# Patient Record
Sex: Female | Born: 1960 | Race: White | Hispanic: No | Marital: Married | Smoking: Current every day smoker
Health system: Southern US, Community
[De-identification: ages and names within clinical notes are randomized; demographics above are authoritative.]

## PROBLEM LIST (undated history)

## (undated) DIAGNOSIS — Z5189 Encounter for other specified aftercare: Secondary | ICD-10-CM

## (undated) DIAGNOSIS — F32A Depression, unspecified: Secondary | ICD-10-CM

## (undated) DIAGNOSIS — M1990 Unspecified osteoarthritis, unspecified site: Secondary | ICD-10-CM

## (undated) DIAGNOSIS — J449 Chronic obstructive pulmonary disease, unspecified: Secondary | ICD-10-CM

## (undated) DIAGNOSIS — I10 Essential (primary) hypertension: Secondary | ICD-10-CM

## (undated) DIAGNOSIS — T7840XA Allergy, unspecified, initial encounter: Secondary | ICD-10-CM

## (undated) DIAGNOSIS — E049 Nontoxic goiter, unspecified: Secondary | ICD-10-CM

## (undated) DIAGNOSIS — F419 Anxiety disorder, unspecified: Secondary | ICD-10-CM

## (undated) HISTORY — DX: Nontoxic goiter, unspecified: E04.9

## (undated) HISTORY — DX: Essential (primary) hypertension: I10

## (undated) HISTORY — DX: Depression, unspecified: F32.A

## (undated) HISTORY — PX: TONSILLECTOMY: SHX5217

## (undated) HISTORY — PX: BREAST SURGERY: SHX581

## (undated) HISTORY — DX: Encounter for other specified aftercare: Z51.89

## (undated) HISTORY — DX: Unspecified osteoarthritis, unspecified site: M19.90

## (undated) HISTORY — DX: Chronic obstructive pulmonary disease, unspecified: J44.9

## (undated) HISTORY — DX: Allergy, unspecified, initial encounter: T78.40XA

## (undated) HISTORY — DX: Anxiety disorder, unspecified: F41.9

## (undated) HISTORY — PX: OTHER SURGICAL HISTORY: SHX169

---

## 2006-06-29 ENCOUNTER — Ambulatory Visit: Payer: Self-pay | Admitting: Family Medicine

## 2006-06-29 DIAGNOSIS — E348 Other specified endocrine disorders: Secondary | ICD-10-CM

## 2006-06-29 DIAGNOSIS — E785 Hyperlipidemia, unspecified: Secondary | ICD-10-CM

## 2006-07-29 ENCOUNTER — Ambulatory Visit: Payer: Self-pay | Admitting: Family Medicine

## 2006-07-29 ENCOUNTER — Other Ambulatory Visit: Admission: RE | Admit: 2006-07-29 | Discharge: 2006-07-29 | Payer: Self-pay | Admitting: Family Medicine

## 2006-07-29 ENCOUNTER — Encounter: Payer: Self-pay | Admitting: Family Medicine

## 2006-07-29 DIAGNOSIS — N951 Menopausal and female climacteric states: Secondary | ICD-10-CM

## 2006-07-29 DIAGNOSIS — I10 Essential (primary) hypertension: Secondary | ICD-10-CM

## 2006-07-29 DIAGNOSIS — E049 Nontoxic goiter, unspecified: Secondary | ICD-10-CM | POA: Insufficient documentation

## 2006-07-29 LAB — CONVERTED CEMR LAB
Bilirubin Urine: NEGATIVE
Glucose, Urine, Semiquant: NEGATIVE
Ketones, urine, test strip: NEGATIVE
Nitrite: NEGATIVE
Protein, U semiquant: NEGATIVE
Specific Gravity, Urine: 1.01
Urobilinogen, UA: 0.2
WBC Urine, dipstick: NEGATIVE
pH: 7

## 2006-07-30 ENCOUNTER — Telehealth: Payer: Self-pay | Admitting: Family Medicine

## 2006-07-31 ENCOUNTER — Encounter: Payer: Self-pay | Admitting: Family Medicine

## 2006-07-31 LAB — CONVERTED CEMR LAB
ALT: 13 units/L (ref 0–35)
Cholesterol: 217 mg/dL — ABNORMAL HIGH (ref 0–200)
Glucose, Bld: 92 mg/dL (ref 70–99)
HDL: 52 mg/dL (ref >39)
LDL Cholesterol: 140 mg/dL — ABNORMAL HIGH (ref 0–99)
Sodium: 141 meq/L (ref 135–145)
Triglycerides: 123 mg/dL (ref <150)
VLDL: 25 mg/dL (ref 0–40)

## 2006-08-04 ENCOUNTER — Telehealth (INDEPENDENT_AMBULATORY_CARE_PROVIDER_SITE_OTHER): Payer: Self-pay | Admitting: *Deleted

## 2006-08-17 ENCOUNTER — Telehealth: Payer: Self-pay | Admitting: Family Medicine

## 2006-08-17 ENCOUNTER — Encounter: Payer: Self-pay | Admitting: Family Medicine

## 2006-08-18 ENCOUNTER — Encounter: Payer: Self-pay | Admitting: Family Medicine

## 2006-08-25 ENCOUNTER — Telehealth: Payer: Self-pay | Admitting: Family Medicine

## 2006-08-26 ENCOUNTER — Encounter: Payer: Self-pay | Admitting: Family Medicine

## 2006-08-28 ENCOUNTER — Ambulatory Visit: Payer: Self-pay | Admitting: Family Medicine

## 2006-08-28 DIAGNOSIS — R928 Other abnormal and inconclusive findings on diagnostic imaging of breast: Secondary | ICD-10-CM | POA: Insufficient documentation

## 2006-08-28 DIAGNOSIS — R3129 Other microscopic hematuria: Secondary | ICD-10-CM

## 2006-08-28 LAB — CONVERTED CEMR LAB
Glucose, Urine, Semiquant: NEGATIVE
Ketones, urine, test strip: NEGATIVE
Protein, U semiquant: NEGATIVE
Urobilinogen, UA: NEGATIVE

## 2006-08-29 ENCOUNTER — Encounter: Payer: Self-pay | Admitting: Family Medicine

## 2006-08-29 LAB — CONVERTED CEMR LAB: WBC, UA: NONE SEEN cells/hpf (ref <3)

## 2006-08-31 ENCOUNTER — Telehealth (INDEPENDENT_AMBULATORY_CARE_PROVIDER_SITE_OTHER): Payer: Self-pay | Admitting: *Deleted

## 2006-10-06 ENCOUNTER — Ambulatory Visit: Payer: Self-pay | Admitting: Family Medicine

## 2006-11-19 ENCOUNTER — Ambulatory Visit: Payer: Self-pay | Admitting: Family Medicine

## 2006-11-19 DIAGNOSIS — F411 Generalized anxiety disorder: Secondary | ICD-10-CM | POA: Insufficient documentation

## 2007-01-21 ENCOUNTER — Ambulatory Visit: Payer: Self-pay | Admitting: Family Medicine

## 2007-01-25 ENCOUNTER — Ambulatory Visit: Payer: Self-pay | Admitting: Family Medicine

## 2007-01-25 ENCOUNTER — Encounter: Admission: RE | Admit: 2007-01-25 | Discharge: 2007-01-25 | Payer: Self-pay | Admitting: Family Medicine

## 2007-01-25 DIAGNOSIS — F17200 Nicotine dependence, unspecified, uncomplicated: Secondary | ICD-10-CM | POA: Insufficient documentation

## 2007-09-06 ENCOUNTER — Encounter: Admission: RE | Admit: 2007-09-06 | Discharge: 2007-09-06 | Payer: Self-pay | Admitting: Family Medicine

## 2007-09-06 ENCOUNTER — Encounter: Payer: Self-pay | Admitting: Family Medicine

## 2007-09-06 ENCOUNTER — Ambulatory Visit: Payer: Self-pay | Admitting: Family Medicine

## 2007-09-06 ENCOUNTER — Other Ambulatory Visit: Admission: RE | Admit: 2007-09-06 | Discharge: 2007-09-06 | Payer: Self-pay | Admitting: Family Medicine

## 2007-09-07 ENCOUNTER — Encounter: Payer: Self-pay | Admitting: Family Medicine

## 2007-09-07 LAB — CONVERTED CEMR LAB
ALT: 20 units/L (ref 0–35)
Albumin: 4.5 g/dL (ref 3.5–5.2)
Alkaline Phosphatase: 68 units/L (ref 39–117)
Chloride: 107 meq/L (ref 96–112)
Cholesterol: 198 mg/dL (ref 0–200)
Glucose, Bld: 101 mg/dL — ABNORMAL HIGH (ref 70–99)
HDL goal, serum: 40 mg/dL
HDL: 48 mg/dL (ref >39)
LDL Goal: 130 mg/dL
Total Protein: 7.1 g/dL (ref 6.0–8.3)

## 2007-09-09 LAB — CONVERTED CEMR LAB: Pap Smear: NORMAL

## 2008-02-14 ENCOUNTER — Ambulatory Visit: Payer: Self-pay | Admitting: Family Medicine

## 2008-03-31 ENCOUNTER — Ambulatory Visit: Payer: Self-pay | Admitting: Family Medicine

## 2008-09-14 ENCOUNTER — Ambulatory Visit: Payer: Self-pay | Admitting: Family Medicine

## 2008-09-14 ENCOUNTER — Encounter: Payer: Self-pay | Admitting: Family Medicine

## 2008-09-14 ENCOUNTER — Other Ambulatory Visit: Admission: RE | Admit: 2008-09-14 | Discharge: 2008-09-14 | Payer: Self-pay | Admitting: Family Medicine

## 2008-09-14 LAB — CONVERTED CEMR LAB
Bilirubin Urine: NEGATIVE
Glucose, Urine, Semiquant: NEGATIVE
Ketones, urine, test strip: NEGATIVE
Nitrite: NEGATIVE
Urobilinogen, UA: NEGATIVE
WBC Urine, dipstick: NEGATIVE
pH: 6

## 2008-09-15 LAB — CONVERTED CEMR LAB
AST: 16 units/L (ref 0–37)
Albumin: 4.3 g/dL (ref 3.5–5.2)
Calcium: 9.7 mg/dL (ref 8.4–10.5)
Creatinine, Ser: 0.84 mg/dL (ref 0.40–1.20)
HDL: 47 mg/dL (ref >39)
RBC / HPF: NONE SEEN (ref <3)
Total Protein: 7.1 g/dL (ref 6.0–8.3)
Triglycerides: 107 mg/dL (ref <150)
VLDL: 21 mg/dL (ref 0–40)
WBC, UA: NONE SEEN cells/hpf (ref <3)

## 2008-09-25 ENCOUNTER — Encounter: Admission: RE | Admit: 2008-09-25 | Discharge: 2008-09-25 | Payer: Self-pay | Admitting: Family Medicine

## 2008-10-02 ENCOUNTER — Encounter: Admission: RE | Admit: 2008-10-02 | Discharge: 2008-10-02 | Payer: Self-pay | Admitting: Family Medicine

## 2008-10-25 ENCOUNTER — Telehealth: Payer: Self-pay | Admitting: Family Medicine

## 2008-10-30 ENCOUNTER — Telehealth: Payer: Self-pay | Admitting: Family Medicine

## 2008-11-29 ENCOUNTER — Telehealth (INDEPENDENT_AMBULATORY_CARE_PROVIDER_SITE_OTHER): Payer: Self-pay | Admitting: *Deleted

## 2009-06-29 ENCOUNTER — Other Ambulatory Visit: Admission: RE | Admit: 2009-06-29 | Discharge: 2009-06-29 | Payer: Self-pay | Admitting: Family Medicine

## 2009-06-29 ENCOUNTER — Ambulatory Visit: Payer: Self-pay | Admitting: Family Medicine

## 2009-06-29 LAB — CONVERTED CEMR LAB
Ketones, urine, test strip: NEGATIVE
Nitrite: NEGATIVE
Specific Gravity, Urine: 1.015
Urobilinogen, UA: 0.2
pH: 6.5

## 2009-07-02 ENCOUNTER — Telehealth: Payer: Self-pay | Admitting: Family Medicine

## 2009-07-02 LAB — CONVERTED CEMR LAB
ALT: 14 units/L (ref 0–35)
AST: 13 units/L (ref 0–37)
CO2: 27 meq/L (ref 19–32)
Calcium: 9.3 mg/dL (ref 8.4–10.5)
Chloride: 101 meq/L (ref 96–112)
Cholesterol: 145 mg/dL (ref 0–200)
HDL: 45 mg/dL (ref >39)
LDL Cholesterol: 81 mg/dL (ref 0–99)
TSH: 1.214 microintl units/mL (ref 0.350–4.500)
Total Protein: 7 g/dL (ref 6.0–8.3)
Triglycerides: 93 mg/dL (ref <150)

## 2009-07-03 LAB — CONVERTED CEMR LAB: Pap Smear: NEGATIVE

## 2009-10-01 ENCOUNTER — Encounter: Admission: RE | Admit: 2009-10-01 | Discharge: 2009-10-01 | Payer: Self-pay | Admitting: Family Medicine

## 2010-02-11 ENCOUNTER — Telehealth: Payer: Self-pay | Admitting: Family Medicine

## 2010-02-13 ENCOUNTER — Ambulatory Visit: Payer: Self-pay | Admitting: Family Medicine

## 2010-02-14 ENCOUNTER — Telehealth: Payer: Self-pay | Admitting: Family Medicine

## 2010-02-14 LAB — CONVERTED CEMR LAB
BUN: 18 mg/dL (ref 6–23)
Calcium: 10.7 mg/dL — ABNORMAL HIGH (ref 8.4–10.5)
Potassium: 4.9 meq/L (ref 3.5–5.3)
Sodium: 137 meq/L (ref 135–145)

## 2010-02-20 ENCOUNTER — Telehealth: Payer: Self-pay | Admitting: Family Medicine

## 2010-03-28 ENCOUNTER — Ambulatory Visit: Payer: Self-pay | Admitting: Family Medicine

## 2010-03-29 ENCOUNTER — Encounter: Payer: Self-pay | Admitting: Family Medicine

## 2010-03-29 LAB — CONVERTED CEMR LAB: Calcium Ionized: 1.36 mmol/L — ABNORMAL HIGH (ref 1.12–1.32)

## 2010-05-10 ENCOUNTER — Encounter: Payer: Self-pay | Admitting: Family Medicine

## 2010-05-10 ENCOUNTER — Ambulatory Visit: Payer: Self-pay | Admitting: Family Medicine

## 2010-06-11 NOTE — Letter (Signed)
Summary: Anxiety Questionnaire  Anxiety Questionnaire   Imported By: Lanelle Bal 04/09/2010 13:01:59  _____________________________________________________________________  External Attachment:    Type:   Image     Comment:   External Document

## 2010-06-11 NOTE — Letter (Signed)
Summary: Anxiety Questionnaire  Anxiety Questionnaire   Imported By: Lanelle Bal 02/27/2010 10:44:12  _____________________________________________________________________  External Attachment:    Type:   Image     Comment:   External Document

## 2010-06-11 NOTE — Progress Notes (Signed)
Summary: Doesn't want colonoscopy right now.   ---- Converted from flag ---- ---- 07/02/2009 9:16 AM, Kathlene November wrote:   ---- 07/02/2009 8:42 AM, Darral Dash wrote: Patient does not want to schedule colonoscopy now ,she will call back . ------------------------------

## 2010-06-11 NOTE — Progress Notes (Signed)
Summary: meds  Phone Note Call from Patient Call back at [phone]   Caller: Patient Call For: Nani Gasser MD Summary of Call: pt called and states the insurance co is not going to cover the  lexapro. Pt wants something eles rx at a low dose Initial call taken by: Avon Gully CMA, Duncan Dull),  February 14, 2010 9:18 AM  Follow-up for Phone Call        they should cover it since she has failed several other agent. Jhonnie Garner sould be able 2 get it prior auth'd. Follow-up by: Nani Gasser MD,  February 14, 2010 10:08 AM  Additional Follow-up for Phone Call Additional follow up Details #1::        called pt and told her the pharm should fax over a prior auth form and we can start the PA process Additional Follow-up by: Avon Gully CMA, Duncan Dull),  February 14, 2010 2:56 PM

## 2010-06-11 NOTE — Assessment & Plan Note (Signed)
Summary: F/U anxiety   Vital Signs:  Patient profile:   50 year old female Menstrual status:  postmenopausal Height:      65 inches Weight:      154 pounds Pulse rate:   67 / minute BP sitting:   127 / 78  (right arm) Cuff size:   regular  Vitals Entered By: Avon Gully CMA, Duncan Dull) (March 28, 2010 3:16 PM) CC: f/u mood, meds are helping   Primary Care Yzabella Crunk:  Nani Gasser MD  CC:  f/u mood and meds are helping.  History of Present Illness: f/u mood, meds are helping. Feels she is less teaful. Still feels depressed.  Husband is ill.  Doesn't feel as shakey in the morning. Did cut out her caffeine. Hasn't had to use the lorazepam as much.    Current Medications (verified): 1)  Aspirin 81 Mg Tbec (Aspirin) .... Take One Tablet By Mouth Daily 2)  Cardizem La 120 Mg  Tb24 (Diltiazem Hcl Coated Beads) .... Take 1 Tablet By Mouth Once A Day 3)  Hyzaar 50-12.5 Mg Tabs (Losartan Potassium-Hctz) .... Take 1 Tablet By Mouth Once A Day 4)  Lorazepam 0.5 Mg Tabs (Lorazepam) .Marland Kitchen.. 1 Tab By Mouth Two Times A Day As Needed Axiety 5)  Diclofenac Sodium 75 Mg Tbec (Diclofenac Sodium) .... Take 1 Tablet By Mouth Two Times A Day 6)  Flexeril 10 Mg Tabs (Cyclobenzaprine Hcl) .... Take 1 Tablet By Mouth Once A Day At Bedtime As Needed Muscle Spasm. 7)  Pravastatin Sodium 40 Mg Tabs (Pravastatin Sodium) .... Take 1 Tablet By Mouth Once A Day At Bedtime 8)  Omeprazole 20 Mg Cpdr (Omeprazole) .... Take One Tablet By Mouth Once A Day 9)  Citacal Slow Release 1200 Mg 10)  Citalopram Hydrobromide 10 Mg Tabs (Citalopram Hydrobromide) .... Take 1 Tablet By Mouth Once A Day  Allergies (verified): No Known Drug Allergies  Comments:  Nurse/Medical Assistant: The patient's medications and allergies were reviewed with the patient and were updated in the Medication and Allergy Lists. Avon Gully CMA, Duncan Dull) (March 28, 2010 3:17 PM)  Physical Exam  General:   Well-developed,well-nourished,in no acute distress; alert,appropriate and cooperative throughout examination Lungs:  Normal respiratory effort, chest expands symmetrically. Lungs are clear to auscultation, no crackles or wheezes. Heart:  Normal rate and regular rhythm. S1 and S2 normal without gallop, murmur, click, rub or other extra sounds. Psych:  Cognition and judgment appear intact. Alert and cooperative with normal attention span and concentration. No apparent delusions, illusions, hallucinations   Impression & Recommendations:  Problem # 1:  ANXIETY DISORDER, GENERALIZED (ICD-300.02) Dsicussed different options. talking her into increasing her citalopram to 20mg  daily.   GAD-7 score of 17 today.  Her updated medication list for this problem includes:    Lorazepam 0.5 Mg Tabs (Lorazepam) .Marland Kitchen... 1 tab by mouth two times a day as needed axiety    Citalopram Hydrobromide 20 Mg Tabs (Citalopram hydrobromide) .Marland Kitchen... Take 1 tablet by mouth once a day  Problem # 2:  BLOOD CHEMISTRY, ABNORMAL (ICD-790.99) REcheck calcium level.  Orders: T-Calcium, Ionized ([id number])  Complete Medication List: 1)  Aspirin 81 Mg Tbec (Aspirin) .... Take one tablet by mouth daily 2)  Cardizem La 120 Mg Tb24 (Diltiazem hcl coated beads) .... Take 1 tablet by mouth once a day 3)  Hyzaar 50-12.5 Mg Tabs (Losartan potassium-hctz) .... Take 1 tablet by mouth once a day 4)  Lorazepam 0.5 Mg Tabs (Lorazepam) .Marland Kitchen.. 1 tab by mouth two times a  day as needed axiety 5)  Diclofenac Sodium 75 Mg Tbec (Diclofenac sodium) .... Take 1 tablet by mouth two times a day 6)  Flexeril 10 Mg Tabs (Cyclobenzaprine hcl) .... Take 1 tablet by mouth once a day at bedtime as needed muscle spasm. 7)  Pravastatin Sodium 40 Mg Tabs (Pravastatin sodium) .... Take 1 tablet by mouth once a day at bedtime 8)  Omeprazole 20 Mg Cpdr (Omeprazole) .... Take one tablet by mouth once a day 9)  Citacal Slow Release 1200 Mg  10)  Citalopram  Hydrobromide 20 Mg Tabs (Citalopram hydrobromide) .... Take 1 tablet by mouth once a day  Patient Instructions: 1)  Please schedule a follow-up appointment in 6 weeks for mood. 2)  We will increase your citalopram dose.   Prescriptions: CITALOPRAM HYDROBROMIDE 20 MG TABS (CITALOPRAM HYDROBROMIDE) Take 1 tablet by mouth once a day  #30 x 1   Entered and Authorized by:   Nani Gasser MD   Signed by:   Nani Gasser MD on 03/28/2010   Method used:   Electronically to        [facility] (retail)       [address]       [place], [place]  [address]       Ph: [phone]       Fax: [phone]   RxID:   [id number]    Orders Added: 1)  T-Calcium, Ionized [[id number]] 2)  Est. Patient Level III [93235]

## 2010-06-11 NOTE — Assessment & Plan Note (Signed)
Summary: CPE, mood, nausea   Vital Signs:  Patient profile:   50 year old female Menstrual status:  postmenopausal Height:      65 inches Weight:      165 pounds BMI:     27.56 Pulse rate:   80 / minute BP sitting:   124 / 75  (left arm) Cuff size:   regular  Vitals Entered By: Kathlene November (June 29, 2009 9:16 AM) CC: CPE with pap     Menstrual Status postmenopausal Last PAP Result Normal   Primary Care Provider:  Nani Gasser MD  CC:  CPE with pap.  History of Present Illness: Feels under alot of stress right now.  Mother has dementia and she is the primary care taker on teh weekend.  Daughter has moved to [place].  She restarted the fluoxetine about 3 weeks ago, 20mg , but doesn' really like the way it makes her feel. Feels too sedated and a little dizzy. SAys that she does feel it helps some with her mood.  NOt sleeping well. Very stressed.  Tearful.   Her stomach has been bothering her. Has been intermittant nausea, belching and some flank pain and between her shoulder blades. Still has a GB.  Sometime will then have a BM an hour later.  Has been eating differnt.ly.  REstarted a reflux medicine last week.  Has helped some but not complete resolution of her sxs.  Also have early satiety.    Current Medications (verified): 1)  Aspirin 81 Mg Tbec (Aspirin) .... Take One Tablet By Mouth Daily 2)  Cardizem La 120 Mg  Tb24 (Diltiazem Hcl Coated Beads) .... Take 1 Tablet By Mouth Once A Day 3)  Hyzaar 50-12.5 Mg Tabs (Losartan Potassium-Hctz) .... Take 1 Tablet By Mouth Once A Day 4)  Fluoxetine Hcl 40 Mg Caps (Fluoxetine Hcl) .... Take 1 Tablet By Mouth Once A Day 5)  Lorazepam 0.5 Mg Tabs (Lorazepam) .Marland Kitchen.. 1 Tab By Mouth Two Times A Day As Needed Axiety 6)  Diclofenac Sodium 75 Mg Tbec (Diclofenac Sodium) .... Take 1 Tablet By Mouth Two Times A Day 7)  Flexeril 10 Mg Tabs (Cyclobenzaprine Hcl) .... Take 1 Tablet By Mouth Once A Day At Bedtime As Needed Muscle Spasm. 8)   Pravastatin Sodium 40 Mg Tabs (Pravastatin Sodium) .... Take 1 Tablet By Mouth Once A Day At Bedtime 9)  Omeprazole 20 Mg Cpdr (Omeprazole) .... Take One Tablet By Mouth Once A Day  Allergies (verified): No Known Drug Allergies  Comments:  Nurse/Medical Assistant: The patient's medications and allergies were reviewed with the patient and were updated in the Medication and Allergy Lists. Kathlene November (June 29, 2009 9:20 AM)  Past History:  Past Medical History: Last updated: 01/25/2007 Marlowe Kays.   (WS) Spirometry 01-25-07  Past Surgical History: Last updated: 07/29/2006 Tonsillectomy age 65 Cyst removed from right breast Removed broken tail bone  Family History: Last updated: 09/14/2008 HTN, hi cholesterol, DM (sister and father) Mother with HTN, hi chol, alzheimers.   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  Current Smoker Alcohol use-no Drug use-no Regular exercise-no  Family History: Reviewed history from 09/14/2008 and no changes required. HTN, hi cholesterol, DM (sister and father) Mother with HTN, hi chol, alzheimers.   Social History: Reviewed history from 06/29/2006 and no changes required. Transport planner at [employer].  Current Smoker Alcohol use-no Drug use-no Regular exercise-no  Review of Systems  The patient denies anorexia, fever, weight loss,  weight gain, vision loss, decreased hearing, hoarseness, chest pain, syncope, dyspnea on exertion, peripheral edema, prolonged cough, headaches, hemoptysis, abdominal pain, melena, hematochezia, severe indigestion/heartburn, hematuria, incontinence, genital sores, muscle weakness, suspicious skin lesions, transient blindness, difficulty walking, depression, unusual weight change, abnormal bleeding, enlarged lymph nodes, angioedema, breast masses, and testicular masses.    Physical Exam  General:  Well-developed,well-nourished,in no acute  distress; alert,appropriate and cooperative throughout examination Head:  Normocephalic and atraumatic without obvious abnormalities. No apparent alopecia or balding. Eyes:  No corneal or conjunctival inflammation noted. EOMI. Perrla. Ears:  External ear exam shows no significant lesions or deformities.  Otoscopic examination reveals clear canals, tympanic membranes are intact bilaterally without bulging, retraction, inflammation or discharge. Hearing is grossly normal bilaterally. Nose:  External nasal examination shows no deformity or inflammation. Nasal mucosa are pink and moist without lesions or exudates. Mouth:  Oral mucosa and oropharynx without lesions or exudates.  Teeth in good repair. Neck:  No deformities, masses, or tenderness noted. Chest Wall:  No deformities, masses, or tenderness noted. Breasts:  No mass, nodules, thickening, tenderness, bulging, retraction, inflamation, nipple discharge or skin changes noted.   Lungs:  Normal respiratory effort, chest expands symmetrically. Lungs are clear to auscultation, no crackles or wheezes. Heart:  Normal rate and regular rhythm. S1 and S2 normal without gallop, murmur, click, rub or other extra sounds. Abdomen:  soft, normal bowel sounds, no distention, no hepatomegaly, and no splenomegaly.  Mildly tender over the epigastrum.  Genitalia:  Normal introitus for age, no external lesions, no vaginal discharge, mucosa pink and moist, no vaginal or cervical lesions, no vaginal atrophy, no friaility or hemorrhage, normal uterus size and position, no adnexal masses or tenderness Msk:  No deformity or scoliosis noted of thoracic or lumbar spine.   Pulses:  R and L carotid,radial,femoral,dorsalis pedis and posterior tibial pulses are full and equal bilaterally Extremities:  No clubbing, cyanosis, edema, or deformity noted with normal full range of motion of all joints.   Neurologic:  No cranial nerve deficits noted. Station and gait are normal.  Sensory, motor and coordinative functions appear intact. Skin:  no rashes.   Cervical Nodes:  No lymphadenopathy noted Axillary Nodes:  No palpable lymphadenopathy Psych:  Cognition and judgment appear intact. Alert and cooperative with normal attention span and concentration. No apparent delusions, illusions, hallucinations   Impression & Recommendations:  Problem # 1:  ROUTINE GYNECOLOGICAL EXAMINATION (ICD-V72.31) Discused calcium with vitamin D Discussed screening colonoscopy.  Will add HPV testing to pap. If normal then repeat in 3 years.  Due for screening labs and mammogram.  Orders: T-Comprehensive Metabolic Panel [id number]) T-Lipid Profile ([id number]) T-TSH ([id number]) T-Mammography Bilateral Screening (57846)  Problem # 2:  ANXIETY DISORDER, GENERALIZED (ICD-300.02) Not happy with the prozac. Says it make her feel almost to relaxed like she doesn't care about anything.  She is acutally taking 20mg  daily. Will change to citalopram and f/u in 3-4 weeks. Call if any SE or suicidal thoughts.   Her updated medication list for this problem includes:    Citalopram Hydrobromide 20 Mg Tabs (Citalopram hydrobromide) .Marland Kitchen... 1/2 tab by mouth for one week then increase to 1 whole tab.    Lorazepam 0.5 Mg Tabs (Lorazepam) .Marland Kitchen... 1 tab by mouth two times a day as needed axiety  Problem # 3:  NAUSEA (ICD-787.02) Trial of PPI. Samples of dexilant given.  If not better in one week then call and will schedule a GB U/S.  UA is neg for UTI.  Complete Medication List: 1)  Aspirin 81 Mg Tbec (Aspirin) .... Take one tablet by mouth daily 2)  Cardizem La 120 Mg Tb24 (Diltiazem hcl coated beads) .... Take 1 tablet by mouth once a day 3)  Hyzaar 50-12.5 Mg Tabs (Losartan potassium-hctz) .... Take 1 tablet by mouth once a day 4)  Citalopram Hydrobromide 20 Mg Tabs (Citalopram hydrobromide) .... 1/2 tab by mouth for one week then increase to 1 whole tab. 5)  Lorazepam 0.5 Mg Tabs (Lorazepam)  .Marland Kitchen.. 1 tab by mouth two times a day as needed axiety 6)  Diclofenac Sodium 75 Mg Tbec (Diclofenac sodium) .... Take 1 tablet by mouth two times a day 7)  Flexeril 10 Mg Tabs (Cyclobenzaprine hcl) .... Take 1 tablet by mouth once a day at bedtime as needed muscle spasm. 8)  Pravastatin Sodium 40 Mg Tabs (Pravastatin sodium) .... Take 1 tablet by mouth once a day at bedtime 9)  Omeprazole 20 Mg Cpdr (Omeprazole) .... Take one tablet by mouth once a day  Other Orders: UA Dipstick w/o Micro (automated)  (81003) Gastroenterology Referral (GI)  Patient Instructions: 1)  Can stop fluoexetine and start the citalopram for mood. New rx sent to pharmacy. Follow up in one month so we can adjust dose.  2)  Call next week if not feeing better and will schedule for a Gallbladder U/S.   3)  It is important that you exercise reguarly at least 20 minutes 5 times a week. If you develop chest pain, have severe difficulty breathing, or feel very tired, stop exercising immediately and seek medical attention.  4)  Take calcium +vitamin D daily.  Prescriptions: CITALOPRAM HYDROBROMIDE 20 MG TABS (CITALOPRAM HYDROBROMIDE) 1/2 tab by mouth for one week then increase to 1 whole tab.  #30 x 0   Entered and Authorized by:   Nani Gasser MD   Signed by:   Nani Gasser MD on 06/29/2009   Method used:   Electronically to        [facility] (retail)       [address]       [place], [place]  [address]       Ph: [phone]       Fax: [phone]   RxID:   [id number]   Laboratory Results   Urine Tests  Date/Time Received: 06/29/2009 Date/Time Reported: 06/29/2009  Routine Urinalysis   Color: yellow Appearance: Clear Glucose: negative   (Normal Range: Negative) Bilirubin: negative   (Normal Range: Negative) Ketone: negative   (Normal Range: Negative) Spec. Gravity: 1.015   (Normal Range: 1.003-1.035) Blood: small   (Normal Range: Negative) pH: 6.5   (Normal Range: 5.0-8.0) Protein: negative    (Normal Range: Negative) Urobilinogen: 0.2   (Normal Range: 0-1) Nitrite: negative   (Normal Range: Negative) Leukocyte Esterace: negative   (Normal Range: Negative)         Flu Vaccine Next Due:  Refused

## 2010-06-11 NOTE — Medication Information (Signed)
Summary: Lexapro/[facility]  Lexapro/[facility]   Imported By: Lanelle Bal 02/27/2010 09:20:50  _____________________________________________________________________  External Attachment:    Type:   Image     Comment:   External Document

## 2010-06-11 NOTE — Progress Notes (Signed)
Summary: meds  Phone Note Call from Patient Call back at [phone]   Caller: Patient Call For: Nani Gasser MD Summary of Call: wants to do the citalopram 10 mg because pharm said it was equal to lexapro and is cheaper Initial call taken by: Avon Gully CMA, Duncan Dull),  February 20, 2010 4:13 PM  Follow-up for Phone Call        I offered this during the OV but she wanted to try Lexapro. that Is fine. I will change teh rx.  Follow-up by: Nani Gasser MD,  February 21, 2010 8:10 AM  Additional Follow-up for Phone Call Additional follow up Details #1::        Pt notified.  Additional Follow-up by: Kathlene November LPN,  February 21, 2010 8:16 AM    New/Updated Medications: CITALOPRAM HYDROBROMIDE 10 MG TABS (CITALOPRAM HYDROBROMIDE) Take 1 tablet by mouth once a day Prescriptions: CITALOPRAM HYDROBROMIDE 10 MG TABS (CITALOPRAM HYDROBROMIDE) Take 1 tablet by mouth once a day  #30 x 1   Entered and Authorized by:   Nani Gasser MD   Signed by:   Nani Gasser MD on 02/21/2010   Method used:   Electronically to        [facility] (retail)       [address]       [place], [place]  [address]       Ph: [phone]       Fax: [phone]   RxID:   [id number]

## 2010-06-11 NOTE — Progress Notes (Signed)
Summary: rx  Phone Note Call from Patient   Caller: Patient Call For: Nani Gasser MD Summary of Call: pt called and states she would like a rx for chantix and wants to go on wellbutrin but does not know the dose of the wellbutrin and does not know if they can be taken together Initial call taken by: Avon Gully CMA, Duncan Dull),  February 11, 2010 10:48 AM  Follow-up for Phone Call        REc one or the other. Which would she prefer?  Follow-up by: Nani Gasser MD,  February 11, 2010 11:54 AM  Additional Follow-up for Phone Call Additional follow up Details #1::        pt wants both and states the prozac and other medications did not help. should I just tell her to schedule an appt to discuss? Additional Follow-up by: Avon Gully CMA, Duncan Dull),  February 11, 2010 1:03 PM    Additional Follow-up for Phone Call Additional follow up Details #2::    Needs OV.  She may just need a higher dose on teh celexa. She has been on it since FEB and hasn't followed up.  Follow-up by: Nani Gasser MD,  February 11, 2010 1:08 PM  Additional Follow-up for Phone Call Additional follow up Details #3:: Details for Additional Follow-up Action Taken: Pt notified of above and sent to schedule office visit Additional Follow-up by: Kathlene November LPN,  February 11, 2010 4:58 PM

## 2010-06-11 NOTE — Assessment & Plan Note (Signed)
Summary: f/u on meds   Vital Signs:  Patient profile:   50 year old female Menstrual status:  postmenopausal Height:      65 inches Weight:      154 pounds Pulse rate:   70 / minute BP sitting:   126 / 72  (right arm) Cuff size:   regular  Vitals Entered By: Avon Gully CMA, Duncan Dull) (February 13, 2010 9:31 AM) CC: f/u meds, Hypertension Management   Primary Care Provider:  Nani Gasser MD  CC:  f/u meds and Hypertension Management.  History of Present Illness: Has lost 10 lbs since last OV. Has been eating a low carb, high protein diet.  Not really exercising.  Here to F/u BP.   Feels her anxiety is getting worse. Mom with dementia and husband getting ready to have surgery.She has been very stressed.  Has felt down as well. Started drinking wine at night to help her relax.  Still smoking.  Wants to retry the Chantix but knows her anxiety has to be under control as well. Marland Kitchen Her husband is trying ot quit adn she wants to quit too. Says with prozac, paxil, effexor, sertraline, and wellbutrin  and has fetl too apethetic on it.  Also had weight gain with several of them.   Hypertension History:      She denies headache, chest pain, palpitations, dyspnea with exertion, orthopnea, PND, peripheral edema, visual symptoms, neurologic problems, syncope, and side effects from treatment.  She notes no problems with any antihypertensive medication side effects.        Positive major cardiovascular risk factors include hyperlipidemia, hypertension, and current tobacco user.  Negative major cardiovascular risk factors include female age less than 47 years old, no history of diabetes, and negative family history for ischemic heart disease.        Further assessment for target organ damage reveals no history of ASHD, stroke/TIA, or peripheral vascular disease.     Current Medications (verified): 1)  Aspirin 81 Mg Tbec (Aspirin) .... Take One Tablet By Mouth Daily 2)  Cardizem La 120 Mg  Tb24  (Diltiazem Hcl Coated Beads) .... Take 1 Tablet By Mouth Once A Day 3)  Hyzaar 50-12.5 Mg Tabs (Losartan Potassium-Hctz) .... Take 1 Tablet By Mouth Once A Day 4)  Lorazepam 0.5 Mg Tabs (Lorazepam) .Marland Kitchen.. 1 Tab By Mouth Two Times A Day As Needed Axiety 5)  Diclofenac Sodium 75 Mg Tbec (Diclofenac Sodium) .... Take 1 Tablet By Mouth Two Times A Day 6)  Flexeril 10 Mg Tabs (Cyclobenzaprine Hcl) .... Take 1 Tablet By Mouth Once A Day At Bedtime As Needed Muscle Spasm. 7)  Pravastatin Sodium 40 Mg Tabs (Pravastatin Sodium) .... Take 1 Tablet By Mouth Once A Day At Bedtime 8)  Omeprazole 20 Mg Cpdr (Omeprazole) .... Take One Tablet By Mouth Once A Day 9)  Citacal Slow Release 1200 Mg  Allergies (verified): No Known Drug Allergies  Comments:  Nurse/Medical Assistant: The patient's medications and allergies were reviewed with the patient and were updated in the Medication and Allergy Lists. 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  Current Smoker Alcohol use-no Drug use-no Regular exercise-no  Physical Exam  General:  Well-developed,well-nourished,in no acute distress; alert,appropriate and cooperative throughout examination Head:  Normocephalic and atraumatic without obvious abnormalities. No apparent alopecia or balding. Lungs:  Normal respiratory effort, chest expands symmetrically. Lungs are clear to  auscultation, no crackles or wheezes. Heart:  Normal rate and regular rhythm. S1 and S2 normal without gallop, murmur, click, rub or other extra sounds. Skin:  no rashes.   Cervical Nodes:  No lymphadenopathy noted Psych:  Cognition and judgment appear intact. Alert and cooperative with normal attention span and concentration. No apparent delusions, illusions, hallucinations   Impression & Recommendations:  Problem # 1:  HYPERTENSION, BENIGN ESSENTIAL (ICD-401.1)  Looks  great today. Continue current regimen. Due for BMP.  Her updated medication list for this problem includes:    Cardizem La 120 Mg Tb24 (Diltiazem hcl coated beads) .Marland Kitchen... Take 1 tablet by mouth once a day    Hyzaar 50-12.5 Mg Tabs (Losartan potassium-hctz) .Marland Kitchen... 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   Problem # 3:  ANXIETY DISORDER, GENERALIZED (ICD-300.02) GAD-7 score is 19 (severe) today. Discussed different options. She has done some reading about Lexapro adn is most interested in that. She feels she is very sensitive to medication so will start with the 5mg  tab sdn have her f/u in 3 weeks. Discussed potential SE.  Can use the lorazepam as needed. I think she would benefit from counseling as well.  The following medications were removed from the medication list:    Citalopram Hydrobromide 20 Mg Tabs (Citalopram hydrobromide) .Marland Kitchen... 1/2 tab by mouth for one week then increase to 1 whole tab. Her updated medication list for this problem includes:    Lorazepam 0.5 Mg Tabs (Lorazepam) .Marland Kitchen... 1 tab by mouth two times a day as needed axiety    Lexapro 5 Mg Tabs (Escitalopram oxalate) .Marland Kitchen... Take 1 tablet by mouth once a day  Problem # 4:  DISORDER, TOBACCO USE (ICD-305.1) I would love for her to quit and try the Chantix again but I agree that her mood needs to be much better controlled to safely use this medication and to be successful.   Complete Medication List: 1)  Aspirin 81 Mg Tbec (Aspirin) .... Take one tablet by mouth daily 2)  Cardizem La 120 Mg Tb24 (Diltiazem hcl coated beads) .... Take 1 tablet by mouth once a day 3)  Hyzaar 50-12.5 Mg Tabs (Losartan potassium-hctz) .... Take 1 tablet by mouth once a day 4)  Lorazepam 0.5 Mg Tabs (Lorazepam) .Marland Kitchen.. 1 tab by mouth two times a day as needed axiety 5)  Diclofenac Sodium 75 Mg Tbec (Diclofenac sodium) .... Take 1 tablet by mouth  two times a day 6)  Flexeril 10 Mg Tabs (Cyclobenzaprine hcl) .... Take 1 tablet by mouth once a day at bedtime as needed muscle spasm. 7)  Pravastatin Sodium 40 Mg Tabs (Pravastatin sodium) .... Take 1 tablet by mouth once a day at bedtime 8)  Omeprazole 20 Mg Cpdr (Omeprazole) .... Take one tablet by mouth once a day 9)  Citacal Slow Release 1200 Mg  10)  Lexapro 5 Mg Tabs (Escitalopram oxalate) .... Take 1 tablet by mouth once a day  Hypertension Assessment/Plan:      The patient's hypertensive risk group is category B: At least one risk factor (excluding diabetes) with no target organ damage.  Her calculated 10 year risk of coronary heart disease is 8 %.  Today's blood pressure is 126/72.    Contraindications/Deferment of Procedures/Staging:    Test/Procedure: FLU VAX    Reason for deferment: patient declined   Patient Instructions: 1)  Please schedule a follow-up  appointment in 3 weeks.  Prescriptions: LEXAPRO 5 MG TABS (ESCITALOPRAM OXALATE) Take 1 tablet by mouth once a day  #30 x 0   Entered and Authorized by:   Nani Gasser MD   Signed by:   Nani Gasser MD on 02/13/2010   Method used:   Electronically to        [facility] (retail)       [address]       [place], [place]  [address]       Ph: [phone]       Fax: [phone]   RxID:   [id number]

## 2010-06-13 NOTE — Assessment & Plan Note (Signed)
Summary: 6 week f/u mood   Vital Signs:  Patient profile:   50 year old female Menstrual status:  postmenopausal Height:      65 inches Weight:      158 pounds Pulse rate:   66 / minute BP sitting:   119 / 73  (right arm) Cuff size:   regular  Vitals Entered By: Avon Gully CMA, Duncan Dull) (May 10, 2010 3:34 PM) CC: f/u anxiety   Primary Care Gildo Crisco:  Nani Gasser MD  CC:  f/u anxiety.  History of Present Illness: Not exercising regularly.  Overall says she is better and her mood is better. She is doing well. Still feels some lack of motivation but less irritated. Sleeping well nad denies any SE. Says does feel it has inc her appetite and cares less about what she is eating (less healthy choices).   Current Medications (verified): 1)  Aspirin 81 Mg Tbec (Aspirin) .... Take One Tablet By Mouth Daily 2)  Cardizem La 120 Mg  Tb24 (Diltiazem Hcl Coated Beads) .... Take 1 Tablet By Mouth Once A Day 3)  Hyzaar 50-12.5 Mg Tabs (Losartan Potassium-Hctz) .... Take 1 Tablet By Mouth Once A Day 4)  Lorazepam 0.5 Mg Tabs (Lorazepam) .Marland Kitchen.. 1 Tab By Mouth Two Times A Day As Needed Axiety 5)  Diclofenac Sodium 75 Mg Tbec (Diclofenac Sodium) .... Take 1 Tablet By Mouth Two Times A Day 6)  Flexeril 10 Mg Tabs (Cyclobenzaprine Hcl) .... Take 1 Tablet By Mouth Once A Day At Bedtime As Needed Muscle Spasm. 7)  Pravastatin Sodium 40 Mg Tabs (Pravastatin Sodium) .... Take 1 Tablet By Mouth Once A Day At Bedtime 8)  Omeprazole 20 Mg Cpdr (Omeprazole) .... Take One Tablet By Mouth Once A Day 9)  Citacal Slow Release 1200 Mg 10)  Citalopram Hydrobromide 20 Mg Tabs (Citalopram Hydrobromide) .... Take 1 Tablet By Mouth Once A Day  Allergies (verified): No Known Drug Allergies  Comments:  Nurse/Medical Assistant: The patient's medications and allergies were reviewed with the patient and were updated in the Medication and Allergy Lists. Avon Gully CMA, Duncan Dull) (May 10, 2010 3:35  PM)  Physical Exam  General:  Well-developed,well-nourished,in no acute distress; alert,appropriate and cooperative throughout examination Lungs:  Normal respiratory effort, chest expands symmetrically. Lungs are clear to auscultation, no crackles or wheezes. Heart:  Normal rate and regular rhythm. S1 and S2 normal without gallop, murmur, click, rub or other extra sounds. Skin:  no rashes.     Impression & Recommendations:  Problem # 1:  ANXIETY DISORDER, GENERALIZED (ICD-300.02) GAD-7 score os 6 today. She is doing very well. Discused increasing her dose but she wants to to stay on her current dose.  She says she is happy with her progression.  I strongly encouraged her to start some regular exercise as this will also improve her mood as well.  She does have a neighbor who has been wanting her to come to the gym with her and I encouraged her to do this.  Follow-up in 4 months. Her updated medication list for this problem includes:    Lorazepam 0.5 Mg Tabs (Lorazepam) .Marland Kitchen... 1 tab by mouth two times a day as needed axiety    Citalopram Hydrobromide 20 Mg Tabs (Citalopram hydrobromide) .Marland Kitchen... Take 1 tablet by mouth once a day  Problem # 2:  HYPERCALCEMIA (ICD-275.42) she is completely stopped her calcium supplement and we are due to recheck her calcium today.  He was elevated a month ago. Orders: T-Calcium,  Ionized [id number])  Complete Medication List: 1)  Aspirin 81 Mg Tbec (Aspirin) .... Take one tablet by mouth daily 2)  Cardizem La 120 Mg Tb24 (Diltiazem hcl coated beads) .... Take 1 tablet by mouth once a day 3)  Hyzaar 50-12.5 Mg Tabs (Losartan potassium-hctz) .... Take 1 tablet by mouth once a day 4)  Lorazepam 0.5 Mg Tabs (Lorazepam) .Marland Kitchen.. 1 tab by mouth two times a day as needed axiety 5)  Diclofenac Sodium 75 Mg Tbec (Diclofenac sodium) .... Take 1 tablet by mouth two times a day 6)  Flexeril 10 Mg Tabs (Cyclobenzaprine hcl) .... Take 1 tablet by mouth once a day at bedtime as  needed muscle spasm. 7)  Pravastatin Sodium 40 Mg Tabs (Pravastatin sodium) .... Take 1 tablet by mouth once a day at bedtime 8)  Omeprazole 20 Mg Cpdr (Omeprazole) .... Take one tablet by mouth once a day 9)  Citacal Slow Release 1200 Mg  10)  Citalopram Hydrobromide 20 Mg Tabs (Citalopram hydrobromide) .... Take 1 tablet by mouth once a day  Patient Instructions: 1)  Please schedule a follow-up appointment in 4 months for mood .  2)  Start an exercise routine.    Orders Added: 1)  T-Calcium, Ionized [[id number]] 2)  Est. Patient Level III [14782]

## 2010-06-13 NOTE — Letter (Signed)
Summary: Anxiety Questionnaire  Anxiety Questionnaire   Imported By: Lanelle Bal 05/22/2010 08:59:51  _____________________________________________________________________  External Attachment:    Type:   Image     Comment:   External Document

## 2010-08-16 ENCOUNTER — Other Ambulatory Visit: Payer: Self-pay | Admitting: Family Medicine

## 2010-09-03 ENCOUNTER — Other Ambulatory Visit: Payer: Self-pay | Admitting: Family Medicine

## 2010-09-05 ENCOUNTER — Encounter: Payer: Self-pay | Admitting: Family Medicine

## 2010-09-06 ENCOUNTER — Ambulatory Visit
Admission: RE | Admit: 2010-09-06 | Discharge: 2010-09-06 | Disposition: A | Discharge status: Home / Self Care | Payer: 59 | Source: Ambulatory Visit | Attending: Family Medicine | Admitting: Family Medicine

## 2010-09-06 ENCOUNTER — Telehealth: Payer: Self-pay | Admitting: Family Medicine

## 2010-09-06 ENCOUNTER — Ambulatory Visit (INDEPENDENT_AMBULATORY_CARE_PROVIDER_SITE_OTHER): Payer: 59 | Admitting: Family Medicine

## 2010-09-06 ENCOUNTER — Encounter: Payer: Self-pay | Admitting: Family Medicine

## 2010-09-06 DIAGNOSIS — Z1211 Encounter for screening for malignant neoplasm of colon: Secondary | ICD-10-CM

## 2010-09-06 DIAGNOSIS — M542 Cervicalgia: Secondary | ICD-10-CM

## 2010-09-06 DIAGNOSIS — Z1239 Encounter for other screening for malignant neoplasm of breast: Secondary | ICD-10-CM

## 2010-09-06 DIAGNOSIS — E010 Iodine-deficiency related diffuse (endemic) goiter: Secondary | ICD-10-CM

## 2010-09-06 DIAGNOSIS — E049 Nontoxic goiter, unspecified: Secondary | ICD-10-CM

## 2010-09-06 DIAGNOSIS — Z0000 Encounter for general adult medical examination without abnormal findings: Secondary | ICD-10-CM

## 2010-09-06 DIAGNOSIS — Z1231 Encounter for screening mammogram for malignant neoplasm of breast: Secondary | ICD-10-CM

## 2010-09-06 NOTE — Telephone Encounter (Signed)
Call pt: Single left tiny thyroid cyst. Will await lab results.

## 2010-09-06 NOTE — Progress Notes (Signed)
  Subjective:     Breanna Gomez is a 50 y.o. female and is here for a comprehensive physical exam. The patient reports Pain over both clavicles for one month.  Now radiating into her anterior neck.  Occ feels numb. More painful at night when tries to sleep. no trauma. .She has no dysphagia. Occ burping but no epigastric pain. Feels sore almost daily. Not taking anything for it.  Also noticed occ tingling in her feet bilaterally. She recently started B12 about a week ago.    History   Social History  . Marital Status: Married    Spouse Name: N/A    Number of Children: N/A  . Years of Education: N/A   Occupational History  . Not on file.   Social History Main Topics  . Smoking status: Current Everyday Smoker  . Smokeless tobacco: Not on file  . Alcohol Use: No  . Drug Use: No  . Sexually Active:    Other Topics Concern  . Not on file   Social History Narrative  . No narrative on file   Health Maintenance  Topic Date Due  . Colonoscopy  01/10/2009  . Influenza Vaccine  02/10/2011  . Mammogram  10/02/2011  . Pap Smear  06/29/2012  . Tetanus/tdap  09/15/2018    The following portions of the patient's history were reviewed and updated as appropriate: allergies, current medications, past family history, past medical history, past social history, past surgical history and problem list.  Review of Systems Pertinent items are noted in HPI.   Objective:    BP 123/79  Pulse 81  Ht 5\' 5"  (1.651 m)  Wt 157 lb (71.215 kg)  BMI 26.13 kg/m2 General appearance: alert, cooperative and appears stated age.  Head: Normocephalic, without obvious abnormality, atraumatic Eyes: PEERLA, EOMi, conjunctiva clear.  Ears: normal TM's and external ear canals both ears Nose: Nares normal. Septum midline. Mucosa normal. No drainage or sinus tenderness. Throat: abnormal findings: symmetrically enlarged thyroid, mildly tender. Neck: no adenopathy, no carotid bruit, no JVD and supple, symmetrical,  trachea midline Back: symmetric, no curvature. ROM normal. No CVA tenderness. Lungs: clear to auscultation bilaterally.  Nontender over the clavicles, no masses or lumps.  Breasts: normal appearance, no masses or tenderness Heart: regular rate and rhythm, S1, S2 normal, no murmur, click, rub or gallop Abdomen: soft, non-tender; bowel sounds normal; no masses,  no organomegaly Extremities: extremities normal, atraumatic, no cyanosis or edema Pulses: 2+ and symmetric Skin: Skin color, texture, turgor normal. No rashes or lesions Lymph nodes: Cervical, supraclavicular, and axillary nodes normal.    Assessment:    Healthy female exam.  Neck pain   Plan:     See After Visit Summary for Counseling Recommendations  Screening labs today Discussed need for colonoscopy Due for mammo in May. Pap up to date.  Encouraged using sunscreen, reg exercise and healthy diet.   Neck pain and thyromegaly - will check her thyroid levels and set up for [place].  She had a nuclear scan a few years ago. Also since recently had elevation in her calcium (that improved after stopping her calcium supplement) will recheck today and will also check her PTH as well. Recommend trial of Aleve for pain and anti-inflammatory properties or tylenol. If not controlling her pain then let me know. F/u in 3 weeks.

## 2010-09-07 LAB — LIPID PANEL
LDL Cholesterol: 98 mg/dL (ref 0–99)
Triglycerides: 115 mg/dL (ref <150)
VLDL: 23 mg/dL (ref 0–40)

## 2010-09-07 LAB — COMPLETE METABOLIC PANEL WITH GFR
ALT: 19 U/L (ref 0–35)
AST: 18 U/L (ref 0–37)
Albumin: 4.4 g/dL (ref 3.5–5.2)
Calcium: 10.2 mg/dL (ref 8.4–10.5)
Chloride: 103 mEq/L (ref 96–112)
Potassium: 5.2 mEq/L (ref 3.5–5.3)
Sodium: 139 mEq/L (ref 135–145)

## 2010-09-07 LAB — FOLATE: Folate: 8.8 ng/mL

## 2010-09-07 LAB — VITAMIN B12: Vitamin B-12: 1086 pg/mL — ABNORMAL HIGH (ref 211–911)

## 2010-09-07 LAB — T4, FREE: Free T4: 1.18 ng/dL (ref 0.80–1.80)

## 2010-09-09 ENCOUNTER — Telehealth: Payer: Self-pay | Admitting: Family Medicine

## 2010-09-09 LAB — PTH, INTACT AND CALCIUM
Calcium, Total (PTH): 10.2 mg/dL (ref 8.4–10.5)
PTH: 45.3 pg/mL (ref 14.0–72.0)

## 2010-09-09 NOTE — Telephone Encounter (Signed)
Left message on pts cell

## 2010-09-09 NOTE — Telephone Encounter (Signed)
Call [id number] and PTH are normal. Thyroid panel looks great. Vit b12 is high so can decrease any B12 supplement that she is taking. CMP nad lipids look great!

## 2010-09-10 NOTE — Telephone Encounter (Signed)
Left message on pt's vm with results

## 2010-10-02 ENCOUNTER — Encounter: Payer: Self-pay | Admitting: Family Medicine

## 2010-10-02 ENCOUNTER — Ambulatory Visit (INDEPENDENT_AMBULATORY_CARE_PROVIDER_SITE_OTHER): Payer: 59 | Admitting: Family Medicine

## 2010-10-02 DIAGNOSIS — J329 Chronic sinusitis, unspecified: Secondary | ICD-10-CM

## 2010-10-02 MED ORDER — AMOXICILLIN-POT CLAVULANATE 875-125 MG PO TABS: 1.0000 | ORAL_TABLET | Freq: Two times a day (BID) | ORAL | Status: AC

## 2010-10-02 NOTE — Progress Notes (Signed)
  Subjective:    Patient ID: Breanna Gomez, female    DOB: 1960-11-02, 50 y.o.   MRN: [id number]  HPI Started about for several weeks. Had a mild ST and post nasaldrip for a couple of weeksbut has gradually getting worse esp the last 5 days. She has tried sinus meds and allergy meds (benadryl, claritin).  None of these have helped. Now has severe nasal congsetoin  Teeth are hurting.  Also feels  apressure in her chest. No cough.  She is a smoker.     Review of Systems     Objective:   Physical Exam  Constitutional: She is oriented to person, place, and time. She appears well-developed and well-nourished.  HENT:  Head: Normocephalic and atraumatic.  Right Ear: External ear normal.  Left Ear: External ear normal.  Nose: Nose normal.  Mouth/Throat: Oropharynx is clear and moist.       TMs and canals are clear bilaterally.   Eyes: Conjunctivae and EOM are normal. Pupils are equal, round, and reactive to light.  Neck: Neck supple. No thyromegaly present.  Cardiovascular: Normal rate, regular rhythm and normal heart sounds.   Pulmonary/Chest: Effort normal and breath sounds normal.  Lymphadenopathy:    She has no cervical adenopathy.  Neurological: She is alert and oriented to person, place, and time.  Skin: Skin is warm and dry.  Psychiatric: She has a normal mood and affect. Her behavior is normal.          Assessment & Plan:  Sinusitis - Likely bacterial. Will tx.  All if not better in one week. Can add mucinex for her chest congestion. Her chest exam and pulse are reassuring. If chest sxs not resolving then call the office and will order ]CXR.

## 2010-10-14 ENCOUNTER — Other Ambulatory Visit: Payer: Self-pay | Admitting: *Deleted

## 2010-10-15 ENCOUNTER — Ambulatory Visit
Admission: RE | Admit: 2010-10-15 | Discharge: 2010-10-15 | Disposition: A | Discharge status: Home / Self Care | Payer: 59 | Source: Ambulatory Visit | Attending: Family Medicine | Admitting: Family Medicine

## 2010-10-15 DIAGNOSIS — Z1231 Encounter for screening mammogram for malignant neoplasm of breast: Secondary | ICD-10-CM

## 2010-10-17 ENCOUNTER — Telehealth: Payer: Self-pay | Admitting: Family Medicine

## 2010-10-17 NOTE — Telephone Encounter (Signed)
Pt notified that mammo normal.  Told to repeat 1 year. Jarvis Newcomer, LPN Domingo Dimes

## 2010-10-17 NOTE — Telephone Encounter (Signed)
Please call patient. Normal mammogram.  Repeat in 1 year.  

## 2010-10-23 ENCOUNTER — Other Ambulatory Visit: Payer: Self-pay | Admitting: Family Medicine

## 2010-10-23 DIAGNOSIS — I10 Essential (primary) hypertension: Secondary | ICD-10-CM

## 2010-10-23 MED ORDER — DILTIAZEM HCL ER COATED BEADS 120 MG PO CP24: 120.0000 mg | ORAL_CAPSULE | Freq: Every day | ORAL | Status: DC

## 2010-10-23 NOTE — Telephone Encounter (Signed)
Received fax from [facility] for refill of diltiazem 120 mg.  # 30 /5 refills were sent to the patient pharm at [facility] in [place]. Jarvis Newcomer, LPN Domingo Dimes

## 2010-10-25 ENCOUNTER — Ambulatory Visit (INDEPENDENT_AMBULATORY_CARE_PROVIDER_SITE_OTHER): Payer: 59 | Admitting: Family Medicine

## 2010-10-25 ENCOUNTER — Encounter: Payer: Self-pay | Admitting: Family Medicine

## 2010-10-25 VITALS — BP 117/76 | HR 79 | Resp 20 | Ht 65.0 in | Wt 159.0 lb

## 2010-10-25 DIAGNOSIS — I10 Essential (primary) hypertension: Secondary | ICD-10-CM

## 2010-10-25 DIAGNOSIS — E049 Nontoxic goiter, unspecified: Secondary | ICD-10-CM

## 2010-10-25 MED ORDER — BUPROPION HCL ER (SR) 150 MG PO TB12: 150.0000 mg | ORAL_TABLET | Freq: Two times a day (BID) | ORAL | Status: DC

## 2010-10-25 NOTE — Progress Notes (Signed)
  Subjective:    Patient ID: Breanna Gomez, female    DOB: Mar 18, 1961, 50 y.o.   MRN: [id number]  HPI  She is here to followup on her thyroid today. About 2 months ago we did do an ultrasound showed a small tiny thyroid cysts. We also checked her thyroid levels which were normal. She currently is doing well. She does have a history of a goiter.  She wonders as her blood pressure has been so well controlled s she wonders if we could decrease her blood pressure medication. She is not having any side effects or symptoms she just wants to come off of some of her medicine. Review of Systems     Objective:   Physical Exam  Constitutional: She is oriented to person, place, and time. She appears well-developed and well-nourished.  HENT:  Head: Normocephalic and atraumatic.  Eyes: Conjunctivae are normal.  Neck: Neck supple. Thyromegaly present.       Thyroid is symmetric without nodules.  Cardiovascular: Normal rate, regular rhythm and normal heart sounds.   Pulmonary/Chest: Effort normal and breath sounds normal.  Neurological: She is alert and oriented to person, place, and time.  Skin: Skin is warm and dry.  Psychiatric: She has a normal mood and affect.          Assessment & Plan:

## 2010-10-25 NOTE — Patient Instructions (Signed)
Stop your cardizem for one month and then come in for a blood pressure check with the nurse.

## 2010-10-25 NOTE — Assessment & Plan Note (Signed)
We will try stopping her Cardizem and after one month off the medication we will recheck her blood pressure and see if she can do well with just the losartan HCTZ. She says she likes a diuretic and doesn't want to get rid of that. She is not having any palpitations or chest pain or shortness of breath.

## 2010-10-25 NOTE — Assessment & Plan Note (Signed)
We'll recheck her thyroid today and if it is normal and stable we will recheck in one year.

## 2010-10-26 LAB — TSH: TSH: 1.296 u[IU]/mL (ref 0.350–4.500)

## 2010-10-26 LAB — VITAMIN B12: Vitamin B-12: 570 pg/mL (ref 211–911)

## 2010-10-28 ENCOUNTER — Telehealth: Payer: Self-pay | Admitting: Family Medicine

## 2010-10-28 NOTE — Telephone Encounter (Signed)
LMOM advising pt of test results. 

## 2010-10-28 NOTE — Telephone Encounter (Signed)
Cough patient: Thyroid level looks great. I did recheck her B12 and she's not taking the supplement and it is 520 which is well within the normal range.

## 2010-11-27 ENCOUNTER — Other Ambulatory Visit: Payer: Self-pay | Admitting: Family Medicine

## 2011-02-07 ENCOUNTER — Other Ambulatory Visit: Payer: Self-pay | Admitting: Family Medicine

## 2011-02-18 ENCOUNTER — Other Ambulatory Visit: Payer: Self-pay | Admitting: Family Medicine

## 2011-04-15 ENCOUNTER — Other Ambulatory Visit: Payer: Self-pay | Admitting: Family Medicine

## 2011-07-14 ENCOUNTER — Other Ambulatory Visit: Payer: Self-pay | Admitting: Family Medicine

## 2011-08-16 ENCOUNTER — Other Ambulatory Visit: Payer: Self-pay | Admitting: Family Medicine

## 2011-09-09 ENCOUNTER — Other Ambulatory Visit: Payer: Self-pay | Admitting: Family Medicine

## 2011-09-09 ENCOUNTER — Ambulatory Visit (INDEPENDENT_AMBULATORY_CARE_PROVIDER_SITE_OTHER): Payer: 59 | Admitting: Family Medicine

## 2011-09-09 ENCOUNTER — Encounter: Payer: Self-pay | Admitting: Family Medicine

## 2011-09-09 VITALS — BP 127/72 | HR 89 | Ht 65.0 in | Wt 160.0 lb

## 2011-09-09 DIAGNOSIS — R1011 Right upper quadrant pain: Secondary | ICD-10-CM

## 2011-09-09 DIAGNOSIS — Z1211 Encounter for screening for malignant neoplasm of colon: Secondary | ICD-10-CM

## 2011-09-09 DIAGNOSIS — F419 Anxiety disorder, unspecified: Secondary | ICD-10-CM

## 2011-09-09 DIAGNOSIS — M549 Dorsalgia, unspecified: Secondary | ICD-10-CM

## 2011-09-09 DIAGNOSIS — Z0000 Encounter for general adult medical examination without abnormal findings: Secondary | ICD-10-CM

## 2011-09-09 LAB — COMPLETE METABOLIC PANEL WITH GFR
ALT: 13 U/L (ref 0–35)
AST: 16 U/L (ref 0–37)
Alkaline Phosphatase: 72 U/L (ref 39–117)
Creat: 0.84 mg/dL (ref 0.50–1.10)
Sodium: 138 mEq/L (ref 135–145)
Total Bilirubin: 0.5 mg/dL (ref 0.3–1.2)
Total Protein: 6.8 g/dL (ref 6.0–8.3)

## 2011-09-09 LAB — LIPID PANEL
HDL: 50 mg/dL (ref >39)
LDL Cholesterol: 131 mg/dL — ABNORMAL HIGH (ref 0–99)
Triglycerides: 112 mg/dL (ref <150)
VLDL: 22 mg/dL (ref 0–40)

## 2011-09-09 MED ORDER — CYCLOBENZAPRINE HCL 10 MG PO TABS: 10.0000 mg | ORAL_TABLET | Freq: Every evening | ORAL | Status: DC | PRN

## 2011-09-09 MED ORDER — MELOXICAM 7.5 MG PO TABS: 7.5000 mg | ORAL_TABLET | Freq: Every day | ORAL | Status: DC

## 2011-09-09 MED ORDER — ATENOLOL 50 MG PO TABS: 25.0000 mg | ORAL_TABLET | Freq: Every day | ORAL | Status: DC

## 2011-09-09 MED ORDER — PRAVASTATIN SODIUM 80 MG PO TABS: 80.0000 mg | ORAL_TABLET | Freq: Every day | ORAL | Status: DC

## 2011-09-09 NOTE — Progress Notes (Signed)
Subjective:     Breanna Gomez is a 51 y.o. female and is here for a comprehensive physical exam. The patient reports no problems. She has been having a lot of problems with her reflux lately. She says she avoids drinking sodas and has decreased her caffeine to one beverage per day. She wants to she can start Prevacid which is over-the-counter and how long she can take it. It does seem to help when she uses it.  She wants to make sure that it works long term. She also has had some intermittent episodes of pain that starts in the epigastrium and radiates around into the right upper Cordran. She says her MS-like attacks. She gets significant pain and some nausea. No vomiting. She's not sure if it's related to eating or not. No recent fever or change in bowels.  She also complains of significant low back pain. Has been ongoing for years.  Has more recently been persistant. Says now hurts to some degree every days.  Say radiates into th eright buttock and into right leg. She doesn't want an MRI but wants to know what she should do. Would like a refill on the flexeril. Has used int he past and helped.  Also tried Aleve but upset her stomach and didn't really help her pain.  Hans't tried any other antiinflammatories. Worse with ridding lawmower.  No alleviating sxs.   History   Social History  . Marital Status: Married    Spouse Name: N/A    Number of Children: 2  . Years of Education: N/A   Occupational History  .     Social History Main Topics  . Smoking status: Current Everyday Smoker -- 0.5 packs/day for 30 years    Types: Cigarettes  . Smokeless tobacco: Not on file  . Alcohol Use: No  . Drug Use: No  . Sexually Active: Yes -- Female partner(s)   Other Topics Concern  . Not on file   Social History Narrative   No regular exercise.     Health Maintenance  Topic Date Due  . Colonoscopy  01/10/2009  . Influenza Vaccine  02/10/2012  . Pap Smear  06/29/2012  . Mammogram  10/14/2012  .  Tetanus/tdap  09/15/2018    The following portions of the patient's history were reviewed and updated as appropriate: allergies, current medications, past family history, past medical history, past social history, past surgical history and problem list.  Review of Systems A comprehensive review of systems was negative.   Objective:    BP 127/72  Pulse 89  Ht 5\' 5"  (1.651 m)  Wt 160 lb (72.576 kg)  BMI 26.63 kg/m2  LMP 05/12/2000 General appearance: alert, cooperative and appears stated age Head: Normocephalic, without obvious abnormality, atraumatic Eyes: conj clear, EOMi, PERRLA Ears: normal TM's and external ear canals both ears Nose: Nares normal. Septum midline. Mucosa normal. No drainage or sinus tenderness. Throat: lips, mucosa, and tongue normal; teeth and gums normal Neck: no adenopathy, no carotid bruit, no JVD, supple, symmetrical, trachea midline and thyroid not enlarged, symmetric, no tenderness/mass/nodules Back: symmetric, no curvature. ROM normal. No CVA tenderness. Lungs: clear to auscultation bilaterally Breasts: normal appearance, no masses or tenderness Heart: regular rate and rhythm, S1, S2 normal, no murmur, click, rub or gallop Abdomen: soft, non-tender; bowel sounds normal; no masses,  no organomegaly Extremities: extremities normal, atraumatic, no cyanosis or edema Pulses: 2+ and symmetric Skin: Skin color, texture, turgor normal. No rashes or lesions Lymph nodes: Cervical, supraclavicular,  and axillary nodes normal. Neurologic: Alert and oriented X 3, normal strength and tone. Normal symmetric reflexes. Normal coordination and gait    Assessment:    Healthy female exam.      Plan:     See After Visit Summary for Counseling Recommendations  Start a regular exercise program and make sure you are eating a healthy diet Try to eat 4 servings of dairy a day or take a calcium supplement (500mg  twice a day). Your vaccines are up to date.   Back Pain - Pain  is now daily.  RF the flexeril. Will try mobic which should be more gentle on the stomach. Also discussed PT vs chiropractor. She is most interestred in possibly trying a [place]. She will check with her insurance.  If she decides she wants to physical therapy then placed a call and I'll be happy to make a referral.  GERD - Given H.O on GERD preventions.  Can try prevacid OTC daily for 6-8 weeks then wean to every other day and then swtich to zantac. Warned of long term risks of PPI with GI infections and fractures.   RUQ Pain - Will refer for [place] to evaluate her gallbladder and rule out cholecystitis. Certainly this could be an extension of her recent GERD. She could have a peptic ulcer but I think because her pain is radiating into the right upper quadrant and seems to be more of an attack and we need to evaluate for cholecystitis. She has not had any fevers or nausea. she has no pain or tenderness on exam today.  Anxiety - Would really like to try a betablocker. I explained her that this doesn't usually work as well as an SSRI but certainly we could try it and see if it helps. A lap or anxiety focuses around driving and she doesn't want to take a benzodiazepine before driving which I agree is completely appropriate.

## 2011-09-09 NOTE — Patient Instructions (Signed)
Start a regular exercise program and make sure you are eating a healthy diet Try to eat 4 servings of dairy a day or take a calcium supplement (500mg  twice a day). Your vaccines are up to date.    Diet for GERD or PUD Nutrition therapy can help ease the discomfort of gastroesophageal reflux disease (GERD) and peptic ulcer disease (PUD).   HOME CARE INSTRUCTIONS    Eat your meals slowly, in a relaxed setting.   Eat 5 to 6 small meals per day.   If a food causes distress, stop eating it for a period of time.  FOODS TO AVOID  Coffee, regular or decaffeinated.   Cola beverages, regular or low calorie.   Tea, regular or decaffeinated.   Pepper.   Cocoa.   High fat foods, including meats.   Butter, margarine, hydrogenated oil (trans fats).   Peppermint or spearmint (if you have GERD).   Fruits and vegetables if not tolerated.   Alcohol.   Nicotine (smoking or chewing). This is one of the most potent stimulants to acid production in the gastrointestinal tract.   Any food that seems to aggravate your condition.  If you have questions regarding your diet, ask your caregiver or a registered dietitian. TIPS  Lying flat may make symptoms worse. Keep the head of your bed raised 6 to 9 inches (15 to 23 cm) by using a foam wedge or blocks under the legs of the bed.   Do not lay down until 3 hours after eating a meal.   Daily physical activity may help reduce symptoms.  MAKE SURE YOU:    Understand these instructions.   Will watch your condition.   Will get help right away if you are not doing well or get worse.  Document Released: 04/28/2005 Document Revised: 04/17/2011 Document Reviewed: 03/14/2011 [facility] Patient Information 2012 [place], [place].

## 2011-09-15 ENCOUNTER — Ambulatory Visit
Admission: RE | Admit: 2011-09-15 | Discharge: 2011-09-15 | Disposition: A | Discharge status: Home / Self Care | Payer: 59 | Source: Ambulatory Visit | Attending: Family Medicine | Admitting: Family Medicine

## 2011-09-15 ENCOUNTER — Other Ambulatory Visit: Payer: Self-pay | Admitting: Family Medicine

## 2011-09-15 DIAGNOSIS — R1011 Right upper quadrant pain: Secondary | ICD-10-CM

## 2011-09-15 DIAGNOSIS — Z1231 Encounter for screening mammogram for malignant neoplasm of breast: Secondary | ICD-10-CM

## 2011-09-16 ENCOUNTER — Other Ambulatory Visit: Payer: Self-pay | Admitting: Family Medicine

## 2011-10-21 ENCOUNTER — Ambulatory Visit: Payer: 59

## 2011-10-27 ENCOUNTER — Encounter: Payer: Self-pay | Admitting: *Deleted

## 2011-10-28 ENCOUNTER — Ambulatory Visit
Admission: RE | Admit: 2011-10-28 | Discharge: 2011-10-28 | Disposition: A | Discharge status: Home / Self Care | Payer: 59 | Source: Ambulatory Visit | Attending: Family Medicine | Admitting: Family Medicine

## 2011-10-28 DIAGNOSIS — Z1231 Encounter for screening mammogram for malignant neoplasm of breast: Secondary | ICD-10-CM

## 2011-10-29 ENCOUNTER — Encounter: Payer: Self-pay | Admitting: *Deleted

## 2011-11-03 ENCOUNTER — Other Ambulatory Visit: Payer: Self-pay | Admitting: Family Medicine

## 2011-11-03 DIAGNOSIS — R928 Other abnormal and inconclusive findings on diagnostic imaging of breast: Secondary | ICD-10-CM

## 2011-11-06 ENCOUNTER — Other Ambulatory Visit: Payer: Self-pay | Admitting: Family Medicine

## 2011-11-14 ENCOUNTER — Ambulatory Visit
Admission: RE | Admit: 2011-11-14 | Discharge: 2011-11-14 | Disposition: A | Discharge status: Home / Self Care | Payer: 59 | Source: Ambulatory Visit | Attending: Family Medicine | Admitting: Family Medicine

## 2011-11-14 ENCOUNTER — Other Ambulatory Visit: Payer: Self-pay | Admitting: Family Medicine

## 2011-11-14 DIAGNOSIS — R928 Other abnormal and inconclusive findings on diagnostic imaging of breast: Secondary | ICD-10-CM

## 2011-11-14 DIAGNOSIS — N6320 Unspecified lump in the left breast, unspecified quadrant: Secondary | ICD-10-CM

## 2011-11-27 ENCOUNTER — Ambulatory Visit
Admission: RE | Admit: 2011-11-27 | Discharge: 2011-11-27 | Disposition: A | Discharge status: Home / Self Care | Payer: 59 | Source: Ambulatory Visit | Attending: Family Medicine | Admitting: Family Medicine

## 2011-11-27 ENCOUNTER — Other Ambulatory Visit: Payer: Self-pay | Admitting: Family Medicine

## 2011-11-27 DIAGNOSIS — N6320 Unspecified lump in the left breast, unspecified quadrant: Secondary | ICD-10-CM

## 2012-01-13 ENCOUNTER — Other Ambulatory Visit: Payer: Self-pay | Admitting: Family Medicine

## 2012-02-19 ENCOUNTER — Other Ambulatory Visit: Payer: Self-pay | Admitting: Family Medicine

## 2012-03-09 ENCOUNTER — Ambulatory Visit (INDEPENDENT_AMBULATORY_CARE_PROVIDER_SITE_OTHER): Payer: 59

## 2012-03-09 ENCOUNTER — Encounter: Payer: Self-pay | Admitting: Family Medicine

## 2012-03-09 ENCOUNTER — Ambulatory Visit (INDEPENDENT_AMBULATORY_CARE_PROVIDER_SITE_OTHER): Payer: 59 | Admitting: Family Medicine

## 2012-03-09 VITALS — BP 129/67 | HR 91 | Ht 65.0 in | Wt 162.0 lb

## 2012-03-09 DIAGNOSIS — R599 Enlarged lymph nodes, unspecified: Secondary | ICD-10-CM

## 2012-03-09 DIAGNOSIS — M542 Cervicalgia: Secondary | ICD-10-CM

## 2012-03-09 NOTE — Progress Notes (Signed)
  Subjective:    Patient ID: Breanna Gomez, female    DOB: [birth year], 51 y.o.   MRN: [id number]  HPI  Neck pain for 1 months. Has been moslty on the left side and occ shoots up towards her left ear. Thought it was allergies initially but her alergies have gotten better but neck pain has remained.  Says feels like can't turn it to the left all the way.  Can't take NSAIDs bc of GI ulcers.  Tylenol doesn't help.  Flexeril doesn't help.  No old or recent injuries to her neck.  No radiation into arm.  Says has been using melatonin for sleep. Says worse in the AM. Says usually a dull ache. Gets better during the day.  No HA.  No fever. No dental problems recently.   Review of Systems     Objective:   Physical Exam  Constitutional: She is oriented to person, place, and time. She appears well-developed and well-nourished.  HENT:  Head: Normocephalic and atraumatic.  Right Ear: External ear normal.  Left Ear: External ear normal.  Nose: Nose normal.  Mouth/Throat: Oropharynx is clear and moist.       TMs and canals are clear.   Eyes: Conjunctivae normal and EOM are normal. Pupils are equal, round, and reactive to light.  Neck: Neck supple. No thyromegaly present.       Palpate an approximately 1 cm lymph node over the left sternomastoid.  Pulmonary/Chest: She has no wheezes.  Musculoskeletal:       Neck with normal flexion, extension, rotation right and left and symmetric. The she did have discomfort with rotation to the left. Normal side bending to the right and left but she does have discomfort with sidebending to the left. Nontender over the cervical spine. Nontender over the paraspinous muscles. She does have tightness of the trapezius.  Lymphadenopathy:    She has no cervical adenopathy.  Neurological: She is alert and oriented to person, place, and time.  Skin: Skin is warm and dry.  Psychiatric: She has a normal mood and affect.          Assessment & Plan:  Neck pain - unclear  etiology they may be secondary to lymphadenopathy. She did note that she had a CBC drawn a few weeks ago and her white count was slightly elevated. In fact the physician asked her if she felt that it was sick. She did not connect with the neck discomfort time. We will repeat the CBC for white count still borderline elevated then consider a trial of antibiotics to see if we can improve the lymphadenopathy as well as reduce her white count. We'll also get a cervical x-ray since she has had pain for over a month at this point. We will call her with results. If the CBC is normal and the x-ray is normal we'll treat as a muscle strain and consider physical therapy. Muscle relaxers do not work. She cannot take NSAIDs because of her history of recent GI ulcers. Apparently able to use Tylenol.

## 2012-03-10 ENCOUNTER — Other Ambulatory Visit: Payer: Self-pay | Admitting: Family Medicine

## 2012-03-10 LAB — CBC WITH DIFFERENTIAL/PLATELET
Basophils Absolute: 0.1 10*3/uL (ref 0.0–0.1)
Basophils Relative: 1 % (ref 0–1)
Eosinophils Relative: 3 % (ref 0–5)
HCT: 45 % (ref 36.0–46.0)
MCHC: 34.7 g/dL (ref 30.0–36.0)
Monocytes Absolute: 0.6 10*3/uL (ref 0.1–1.0)
Neutro Abs: 5 10*3/uL (ref 1.7–7.7)
Platelets: 404 10*3/uL — ABNORMAL HIGH (ref 150–400)
RDW: 13.5 % (ref 11.5–15.5)

## 2012-03-10 MED ORDER — CEPHALEXIN 500 MG PO CAPS: 500.0000 mg | ORAL_CAPSULE | Freq: Three times a day (TID) | ORAL | Status: DC

## 2012-03-24 ENCOUNTER — Other Ambulatory Visit: Payer: Self-pay | Admitting: Family Medicine

## 2012-05-03 ENCOUNTER — Encounter: Payer: Self-pay | Admitting: Family Medicine

## 2012-05-28 ENCOUNTER — Other Ambulatory Visit: Payer: Self-pay | Admitting: Family Medicine

## 2012-07-26 ENCOUNTER — Ambulatory Visit (INDEPENDENT_AMBULATORY_CARE_PROVIDER_SITE_OTHER): Payer: 59 | Admitting: Physician Assistant

## 2012-07-26 ENCOUNTER — Encounter: Payer: Self-pay | Admitting: Physician Assistant

## 2012-07-26 VITALS — BP 136/84 | HR 72 | Temp 97.5°F | Wt 166.0 lb

## 2012-07-26 DIAGNOSIS — J019 Acute sinusitis, unspecified: Secondary | ICD-10-CM

## 2012-07-26 DIAGNOSIS — R05 Cough: Secondary | ICD-10-CM

## 2012-07-26 DIAGNOSIS — J209 Acute bronchitis, unspecified: Secondary | ICD-10-CM

## 2012-07-26 DIAGNOSIS — R062 Wheezing: Secondary | ICD-10-CM

## 2012-07-26 MED ORDER — AMOXICILLIN-POT CLAVULANATE 875-125 MG PO TABS: 1.0000 | ORAL_TABLET | Freq: Two times a day (BID) | ORAL | Status: DC

## 2012-07-26 MED ORDER — ALBUTEROL SULFATE HFA 108 (90 BASE) MCG/ACT IN AERS: 2.0000 | INHALATION_SPRAY | Freq: Four times a day (QID) | RESPIRATORY_TRACT | Status: DC | PRN

## 2012-07-26 MED ORDER — HYDROCODONE-HOMATROPINE 5-1.5 MG/5ML PO SYRP: 5.0000 mL | ORAL_SOLUTION | Freq: Every evening | ORAL | Status: DC | PRN

## 2012-07-26 NOTE — Patient Instructions (Addendum)
Gave cough syrup.  Start antibotic.  Use albuterol as needed up to every 6 hours 2 puffs.   Call if not improving.

## 2012-07-26 NOTE — Progress Notes (Signed)
  Subjective:    Patient ID: Breanna Gomez, female    DOB: 05-May-1961, 51 y.o.   MRN: [id number]  HPI Patient presents to the clinic and has not been feeling for last 3 weeks. Started with tickle in throat and developed into productive cough. Production is green. She has a lot of sinus pressure, ear congestion, ST, headaches. She has tried Mucinex, Advil cold and sinus, Desylm. No relief. She has a sick mother in a nursing home and really needs to go be able to see her. Denies any fever, chills, muscle aches. She does feel a little SOB and some mild wheezing. Pt does smoke but no hx of asthma/COPD.    Review of Systems     Objective:   Physical Exam  Constitutional: She is oriented to person, place, and time. She appears well-nourished.  HENT:  Head: Normocephalic and atraumatic.  Right Ear: External ear normal.  Left Ear: External ear normal.  TM's clear. Oropharynx is erythematous with PND.  Bilateral turbinates red and swollen. Sinus pressure maxillary tenderness.  Eyes: Conjunctivae are normal.  Watery discharge from both eyes.  Neck: Normal range of motion. Neck supple.  Cardiovascular: Normal rate, regular rhythm and normal heart sounds.   Pulmonary/Chest: Effort normal.  Wheezing heard at base of left lung.  Neurological: She is alert and oriented to person, place, and time.  Skin: Skin is warm and dry.  Psychiatric: She has a normal mood and affect. Her behavior is normal.          Assessment & Plan:  Bronchitis/sinusitis/cough- Gave cough syrup to use at night. Start Augmentin for 10 days. Gave albuterol inhaler to use up to every 6hours 2 puffs. Discussed symptomatic care. Wrote out of work for 1 day. Call if not improving.

## 2012-08-07 ENCOUNTER — Other Ambulatory Visit: Payer: Self-pay | Admitting: Family Medicine

## 2012-09-22 ENCOUNTER — Ambulatory Visit (INDEPENDENT_AMBULATORY_CARE_PROVIDER_SITE_OTHER): Payer: 59 | Admitting: Family Medicine

## 2012-09-22 ENCOUNTER — Encounter: Payer: Self-pay | Admitting: Family Medicine

## 2012-09-22 ENCOUNTER — Other Ambulatory Visit (HOSPITAL_COMMUNITY)
Admission: RE | Admit: 2012-09-22 | Discharge: 2012-09-22 | Disposition: A | Discharge status: Home / Self Care | Payer: 59 | Source: Ambulatory Visit | Attending: Family Medicine | Admitting: Family Medicine

## 2012-09-22 VITALS — BP 140/76 | HR 66 | Ht 66.0 in | Wt 161.0 lb

## 2012-09-22 DIAGNOSIS — Z124 Encounter for screening for malignant neoplasm of cervix: Secondary | ICD-10-CM

## 2012-09-22 DIAGNOSIS — Z01419 Encounter for gynecological examination (general) (routine) without abnormal findings: Secondary | ICD-10-CM | POA: Insufficient documentation

## 2012-09-22 DIAGNOSIS — Z1151 Encounter for screening for human papillomavirus (HPV): Secondary | ICD-10-CM | POA: Insufficient documentation

## 2012-09-22 DIAGNOSIS — F411 Generalized anxiety disorder: Secondary | ICD-10-CM

## 2012-09-22 DIAGNOSIS — I10 Essential (primary) hypertension: Secondary | ICD-10-CM

## 2012-09-22 DIAGNOSIS — E785 Hyperlipidemia, unspecified: Secondary | ICD-10-CM

## 2012-09-22 LAB — LIPID PANEL
Cholesterol: 189 mg/dL (ref 0–200)
HDL: 46 mg/dL (ref >39)
Total CHOL/HDL Ratio: 4.1 Ratio
Triglycerides: 134 mg/dL (ref <150)
VLDL: 27 mg/dL (ref 0–40)

## 2012-09-22 LAB — COMPLETE METABOLIC PANEL WITH GFR
AST: 15 U/L (ref 0–37)
Alkaline Phosphatase: 66 U/L (ref 39–117)
BUN: 21 mg/dL (ref 6–23)
Creat: 0.78 mg/dL (ref 0.50–1.10)

## 2012-09-22 MED ORDER — LOSARTAN POTASSIUM-HCTZ 50-12.5 MG PO TABS: ORAL_TABLET | ORAL | Status: DC

## 2012-09-22 MED ORDER — PRAVASTATIN SODIUM 80 MG PO TABS: ORAL_TABLET | ORAL | Status: DC

## 2012-09-22 MED ORDER — CITALOPRAM HYDROBROMIDE 20 MG PO TABS: ORAL_TABLET | ORAL | Status: DC

## 2012-09-22 MED ORDER — ATENOLOL 50 MG PO TABS: ORAL_TABLET | ORAL | Status: DC

## 2012-09-22 MED ORDER — LORAZEPAM 0.5 MG PO TABS: ORAL_TABLET | ORAL | Status: DC

## 2012-09-22 NOTE — Progress Notes (Signed)
  Subjective:     Breanna Gomez is a 52 y.o. female and is here for a comprehensive physical exam. The patient reports no problems.  History   Social History  . Marital Status: Married    Spouse Name: N/A    Number of Children: 2  . Years of Education: N/A   Occupational History  .     Social History Main Topics  . Smoking status: Current Every Day Smoker -- 0.50 packs/day for 30 years    Types: Cigarettes  . Smokeless tobacco: Not on file  . Alcohol Use: No  . Drug Use: No  . Sexually Active: Yes -- Female partner(s)   Other Topics Concern  . Not on file   Social History Narrative   No regular exercise.     Health Maintenance  Topic Date Due  . Influenza Vaccine  01/10/2013  . Mammogram  11/26/2013  . Colonoscopy  10/24/2014  . Pap Smear  09/23/2015  . Tetanus/tdap  09/15/2018    The following portions of the patient's history were reviewed and updated as appropriate: allergies, current medications, past family history, past medical history, past social history, past surgical history and problem list.  Review of Systems A comprehensive review of systems was negative.   Objective:    BP 140/76  Pulse 66  Ht 5\' 6"  (1.676 m)  Wt 161 lb (73.029 kg)  BMI 26 kg/m2  LMP 05/12/2000 General appearance: alert, cooperative, appears stated age and mild distress Head: Normocephalic, without obvious abnormality, atraumatic Eyes: conj clear, EOMi, PEERLA Ears: normal TM's and external ear canals both ears Nose: Nares normal. Septum midline. Mucosa normal. No drainage or sinus tenderness. Throat: lips, mucosa, and tongue normal; teeth and gums normal Neck: no adenopathy, no carotid bruit, no JVD, supple, symmetrical, trachea midline and thyroid not enlarged, symmetric, no tenderness/mass/nodules Back: symmetric, no curvature. ROM normal. No CVA tenderness. Lungs: clear to auscultation bilaterally Breasts: normal appearance, no masses or tenderness Heart: regular rate  and rhythm, S1, S2 normal, no murmur, click, rub or gallop Abdomen: soft, non-tender; bowel sounds normal; no masses,  no organomegaly Pelvic: cervix normal in appearance, external genitalia normal, no adnexal masses or tenderness, no cervical motion tenderness, rectovaginal septum normal, uterus normal size, shape, and consistency and vagina normal without discharge Extremities: extremities normal, atraumatic, no cyanosis or edema Pulses: 2+ and symmetric Skin: Skin color, texture, turgor normal. No rashes or lesions2 Lymph nodes: Cervical, supraclavicular, and axillary nodes normal. Neurologic: Alert and oriented X 3, normal strength and tone. Normal symmetric reflexes. Normal coordination and gait    Assessment:    Healthy female exam.      Plan:     See After Visit Summary for Counseling Recommendations  Keep up a regular exercise program and make sure you are eating a healthy diet Try to eat 4 servings of dairy a day, or if you are lactose intolerant take a calcium with vitamin D daily.  Your vaccines are up to date.  Will call with pap results.

## 2013-02-22 ENCOUNTER — Other Ambulatory Visit: Payer: Self-pay | Admitting: Family Medicine

## 2013-05-18 ENCOUNTER — Other Ambulatory Visit: Payer: Self-pay | Admitting: Family Medicine

## 2013-07-22 ENCOUNTER — Other Ambulatory Visit: Payer: Self-pay | Admitting: Family Medicine

## 2013-08-05 ENCOUNTER — Ambulatory Visit (INDEPENDENT_AMBULATORY_CARE_PROVIDER_SITE_OTHER): Payer: 59 | Admitting: Family Medicine

## 2013-08-05 ENCOUNTER — Encounter: Payer: Self-pay | Admitting: Family Medicine

## 2013-08-05 VITALS — BP 108/66 | HR 86 | Ht 65.6 in | Wt 164.0 lb

## 2013-08-05 DIAGNOSIS — E348 Other specified endocrine disorders: Secondary | ICD-10-CM

## 2013-08-05 DIAGNOSIS — F17200 Nicotine dependence, unspecified, uncomplicated: Secondary | ICD-10-CM

## 2013-08-05 DIAGNOSIS — I10 Essential (primary) hypertension: Secondary | ICD-10-CM

## 2013-08-05 DIAGNOSIS — E785 Hyperlipidemia, unspecified: Secondary | ICD-10-CM

## 2013-08-05 DIAGNOSIS — Z0000 Encounter for general adult medical examination without abnormal findings: Secondary | ICD-10-CM

## 2013-08-05 LAB — POCT GLYCOSYLATED HEMOGLOBIN (HGB A1C): Hemoglobin A1C: 6.1

## 2013-08-05 MED ORDER — VARENICLINE TARTRATE 1 MG PO TABS: 1.0000 mg | ORAL_TABLET | Freq: Two times a day (BID) | ORAL | Status: DC

## 2013-08-05 MED ORDER — LOSARTAN POTASSIUM-HCTZ 50-12.5 MG PO TABS: ORAL_TABLET | ORAL | Status: DC

## 2013-08-05 MED ORDER — PRAVASTATIN SODIUM 80 MG PO TABS: ORAL_TABLET | ORAL | Status: DC

## 2013-08-05 MED ORDER — ATENOLOL 50 MG PO TABS: ORAL_TABLET | ORAL | Status: DC

## 2013-08-05 MED ORDER — LORAZEPAM 0.5 MG PO TABS: ORAL_TABLET | ORAL | Status: DC

## 2013-08-05 NOTE — Progress Notes (Signed)
   Subjective:    Patient ID: Breanna Gomez, female    DOB: 09/26/1960, 53 y.o.   MRN: [id number]  HPI Hypertension- Pt denies chest pain, SOB, dizziness, or heart palpitations.  Taking meds as directed w/o problems.  Denies medication side effects.    IFG - last A1C was 5.7 10 months ago.  She admits she has been eating als.  o.  Has been smoking more as well.   Hyperlipidemia - Tolerating statin well.  Not taking it at night.  Forgets to take it at night.   Review of Systems     Objective:   Physical Exam  Constitutional: She is oriented to person, place, and time. She appears well-developed and well-nourished.  HENT:  Head: Normocephalic and atraumatic.  Cardiovascular: Normal rate, regular rhythm and normal heart sounds.   Pulmonary/Chest: Effort normal and breath sounds normal.  Neurological: She is alert and oriented to person, place, and time.  Skin: Skin is warm and dry.  Psychiatric: She has a normal mood and affect. Her behavior is normal.          Assessment & Plan:  HTN- Well controlled. Continue current regimen. Followup in 6 months.  IFG - A1c is 6.1 which is up from 5.7 last year. Encouraged her to work on diet and exercise and weight loss to get this back under control. Recommend repeat A1c in 6 months to keep an eye on this.  Hyperlipidemia -due to recheck lipids and liver enzymes. Lab slip given today. Encouraged to go for her physical in May.  Tobacco abuse-she's interested in starting Chantix again. She used about 45 years ago and did fairly well on it. She has a slight decrease in mood but was able to increase her citalopram to correct for that fracture. She's been smoking a lot over the last several months because of several health issues in her family. She is interested in quitting as she has a new grand baby on the way.

## 2013-08-05 NOTE — Addendum Note (Signed)
Addended by: Deno EtienneBARKLEY, Sherian Valenza L on: 08/05/2013 02:35 PM   Modules accepted: Orders

## 2013-08-05 NOTE — Addendum Note (Signed)
Addended by: Deno EtienneBARKLEY, Prathik Aman L on: 08/05/2013 02:21 PM   Modules accepted: Orders

## 2013-08-05 NOTE — Addendum Note (Signed)
Addended by: Nani GasserMETHENEY, CATHERINE D on: 08/05/2013 02:12 PM   Modules accepted: Orders

## 2013-08-05 NOTE — Patient Instructions (Signed)
Smoking Cessation, Tips for Success If you are ready to quit smoking, congratulations! You have chosen to help yourself be healthier. Cigarettes bring nicotine, tar, carbon monoxide, and other irritants into your body. Your lungs, heart, and blood vessels will be able to work better without these poisons. There are many different ways to quit smoking. Nicotine gum, nicotine patches, a nicotine inhaler, or nicotine nasal spray can help with physical craving. Hypnosis, support groups, and medicines help break the habit of smoking. WHAT THINGS CAN I DO TO MAKE QUITTING EASIER?  Here are some tips to help you quit for good:  Pick a date when you will quit smoking completely. Tell all of your friends and family about your plan to quit on that date.  Do not try to slowly cut down on the number of cigarettes you are smoking. Pick a quit date and quit smoking completely starting on that day.  Throw away all cigarettes.   Clean and remove all ashtrays from your home, work, and car.   On a card, write down your reasons for quitting. Carry the card with you and read it when you get the urge to smoke.   Cleanse your body of nicotine. Drink enough water and fluids to keep your urine clear or pale yellow. Do this after quitting to flush the nicotine from your body.   Learn to predict your moods. Do not let a bad situation be your excuse to have a cigarette. Some situations in your life might tempt you into wanting a cigarette.   Never have "just one" cigarette. It leads to wanting another and another. Remind yourself of your decision to quit.   Change habits associated with smoking. If you smoked while driving or when feeling stressed, try other activities to replace smoking. Stand up when drinking your coffee. Brush your teeth after eating. Sit in a different chair when you read the paper. Avoid alcohol while trying to quit, and try to drink fewer caffeinated beverages. Alcohol and caffeine may urge  you to smoke.   Avoid foods and drinks that can trigger a desire to smoke, such as sugary or spicy foods and alcohol.   Ask people who smoke not to smoke around you.   Have something planned to do right after eating or having a cup of coffee. For example, plan to take a walk or exercise.   Try a relaxation exercise to calm you down and decrease your stress. Remember, you may be tense and nervous for the first 2 weeks after you quit, but this will pass.   Find new activities to keep your hands busy. Play with a pen, coin, or rubber band. Doodle or draw things on paper.   Brush your teeth right after eating. This will help cut down on the craving for the taste of tobacco after meals. You can also try mouthwash.   Use oral substitutes in place of cigarettes. Try using lemon drops, carrots, cinnamon sticks, or chewing gum. Keep them handy so they are available when you have the urge to smoke.   When you have the urge to smoke, try deep breathing.   Designate your home as a nonsmoking area.   If you are a heavy smoker, ask your health care provider about a prescription for nicotine chewing gum. It can ease your withdrawal from nicotine.   Reward yourself. Set aside the cigarette money you save and buy yourself something nice.   Look for support from others. Join a support group or   smoking cessation program. Ask someone at home or at work to help you with your plan to quit smoking.   Always ask yourself, "Do I need this cigarette or is this just a reflex?" Tell yourself, "Today, I choose not to smoke," or "I do not want to smoke." You are reminding yourself of your decision to quit.  Do not replace cigarette smoking with electronic cigarettes (commonly called e-cigarettes). The safety of e-cigarettes is unknown, and some may contain harmful chemicals.  If you relapse, do not give up! Plan ahead and think about what you will do the next time you get the urge to smoke.  HOW WILL  I FEEL WHEN I QUIT SMOKING? You may have symptoms of withdrawal because your body is used to nicotine (the addictive substance in cigarettes). You may crave cigarettes, be irritable, feel very hungry, cough often, get headaches, or have difficulty concentrating. The withdrawal symptoms are only temporary. They are strongest when you first quit but will go away within 10 14 days. When withdrawal symptoms occur, stay in control. Think about your reasons for quitting. Remind yourself that these are signs that your body is healing and getting used to being without cigarettes. Remember that withdrawal symptoms are easier to treat than the major diseases that smoking can cause.  Even after the withdrawal is over, expect periodic urges to smoke. However, these cravings are generally short lived and will go away whether you smoke or not. Do not smoke!  WHAT RESOURCES ARE AVAILABLE TO HELP ME QUIT SMOKING? Your health care provider can direct you to community resources or hospitals for support, which may include:  Group support.  Education.  Hypnosis.  Therapy. Document Released: 01/25/2004 Document Revised: 02/16/2013 Document Reviewed: 10/14/2012 [employer] Patient Information 2014 [employer].  

## 2013-09-17 LAB — COMPLETE METABOLIC PANEL WITH GFR
ALT: 15 U/L (ref 0–35)
AST: 15 U/L (ref 0–37)
Albumin: 4.1 g/dL (ref 3.5–5.2)
Alkaline Phosphatase: 59 U/L (ref 39–117)
BILIRUBIN TOTAL: 0.6 mg/dL (ref 0.2–1.2)
BUN: 11 mg/dL (ref 6–23)
CO2: 26 mEq/L (ref 19–32)
CREATININE: 0.75 mg/dL (ref 0.50–1.10)
Calcium: 9.6 mg/dL (ref 8.4–10.5)
Chloride: 100 mEq/L (ref 96–112)
GFR, Est African American: >89 mL/min
Glucose, Bld: 86 mg/dL (ref 70–99)
Potassium: 4.5 mEq/L (ref 3.5–5.3)
Sodium: 136 mEq/L (ref 135–145)
Total Protein: 6.6 g/dL (ref 6.0–8.3)

## 2013-09-17 LAB — TSH: TSH: 1.798 u[IU]/mL (ref 0.350–4.500)

## 2013-09-17 LAB — LIPID PANEL
CHOLESTEROL: 178 mg/dL (ref 0–200)
HDL: 50 mg/dL (ref >39)
LDL Cholesterol: 103 mg/dL — ABNORMAL HIGH (ref 0–99)
Total CHOL/HDL Ratio: 3.6 Ratio
Triglycerides: 124 mg/dL (ref <150)
VLDL: 25 mg/dL (ref 0–40)

## 2013-09-18 NOTE — Progress Notes (Signed)
Quick Note:  All labs are normal. ______ 

## 2013-09-30 ENCOUNTER — Encounter: Payer: 59 | Admitting: Family Medicine

## 2013-10-13 ENCOUNTER — Encounter: Payer: Self-pay | Admitting: Family Medicine

## 2013-10-14 ENCOUNTER — Encounter: Payer: Self-pay | Admitting: Family Medicine

## 2013-10-14 ENCOUNTER — Ambulatory Visit (INDEPENDENT_AMBULATORY_CARE_PROVIDER_SITE_OTHER): Payer: 59 | Admitting: Family Medicine

## 2013-10-14 VITALS — BP 123/65 | HR 83 | Ht 65.0 in | Wt 164.0 lb

## 2013-10-14 DIAGNOSIS — Z0000 Encounter for general adult medical examination without abnormal findings: Secondary | ICD-10-CM

## 2013-10-14 MED ORDER — CITALOPRAM HYDROBROMIDE 40 MG PO TABS: ORAL_TABLET | ORAL | Status: DC

## 2013-10-14 NOTE — Patient Instructions (Signed)
Keep up a regular exercise program and make sure you are eating a healthy diet Try to eat 4 servings of dairy a day, or if you are lactose intolerant take a calcium with vitamin D daily.  Your vaccines are up to date.   

## 2013-10-14 NOTE — Progress Notes (Signed)
  Subjective:     Breanna Gomez is a 53 y.o. female and is here for a comprehensive physical exam. The patient reports problems - says her anxiety is high righ now. Even her husband has said something to her.  History   Social History  . Marital Status: Married    Spouse Name: N/A    Number of Children: 2  . Years of Education: N/A   Occupational History  .     Social History Main Topics  . Smoking status: Current Every Day Smoker -- 0.50 packs/day for 30 years    Types: Cigarettes  . Smokeless tobacco: Not on file  . Alcohol Use: No  . Drug Use: No  . Sexual Activity: Yes    Partners: Male   Other Topics Concern  . Not on file   Social History Narrative   No regular exercise.     Health Maintenance  Topic Date Due  . Influenza Vaccine  12/10/2013  . Colonoscopy  10/24/2014  . Mammogram  09/03/2015  . Pap Smear  09/23/2015  . Tetanus/tdap  09/15/2018    The following portions of the patient's history were reviewed and updated as appropriate: allergies, current medications, past family history, past medical history, past social history, past surgical history and problem list.  Review of Systems A comprehensive review of systems was negative.   Objective:    BP 123/65  Pulse 83  Ht 5\' 5"  (1.651 m)  Wt 164 lb (74.39 kg)  BMI 27.29 kg/m2  LMP 05/12/2000 General appearance: alert, cooperative and appears stated age Head: Normocephalic, without obvious abnormality, atraumatic Eyes: conj clear, EOM, PEERLA Ears: normal TM's and external ear canals both ears Nose: Nares normal. Septum midline. Mucosa normal. No drainage or sinus tenderness. Throat: lips, mucosa, and tongue normal; teeth and gums normal Neck: no adenopathy, no carotid bruit, no JVD, supple, symmetrical, trachea midline and thyroid not enlarged, symmetric, no tenderness/mass/nodules Back: symmetric, no curvature. ROM normal. No CVA tenderness. Lungs: clear to auscultation bilaterally Breasts:  normal appearance, no masses or tenderness Heart: regular rate and rhythm, S1, S2 normal, no murmur, click, rub or gallop Abdomen: soft, non-tender; bowel sounds normal; no masses,  no organomegaly Extremities: extremities normal, atraumatic, no cyanosis or edema Pulses: 2+ and symmetric Skin: Skin color, texture, turgor normal. No rashes or lesions Lymph nodes: Cervical, supraclavicular, and axillary nodes normal. Neurologic: Alert and oriented X 3, normal strength and tone. Normal symmetric reflexes. Normal coordination and gait    Assessment:    Healthy female exam.      Plan:     See After Visit Summary for Counseling Recommendations  Keep up a regular exercise program and make sure you are eating a healthy diet Try to eat 4 servings of dairy a day, or if you are lactose intolerant take a calcium with vitamin D daily.  Your vaccines are up to date.   IFG - up from last year.  Recheck in 6 months.   HTN- well controlled. Continue current regimen. F/U in 6 months.    GAD- will inc anxiety to citalopram 40mg  daily. Can cut in half if not better. F/U in  Mo.

## 2013-11-01 ENCOUNTER — Other Ambulatory Visit: Payer: Self-pay | Admitting: Family Medicine

## 2013-12-27 ENCOUNTER — Encounter: Payer: Self-pay | Admitting: Family Medicine

## 2013-12-27 ENCOUNTER — Ambulatory Visit (INDEPENDENT_AMBULATORY_CARE_PROVIDER_SITE_OTHER): Payer: 59 | Admitting: Family Medicine

## 2013-12-27 VITALS — BP 129/66 | HR 79 | Ht 65.0 in | Wt 162.0 lb

## 2013-12-27 DIAGNOSIS — I10 Essential (primary) hypertension: Secondary | ICD-10-CM

## 2013-12-27 DIAGNOSIS — F411 Generalized anxiety disorder: Secondary | ICD-10-CM

## 2013-12-27 DIAGNOSIS — E785 Hyperlipidemia, unspecified: Secondary | ICD-10-CM

## 2013-12-27 MED ORDER — ATENOLOL 50 MG PO TABS: ORAL_TABLET | ORAL | Status: DC

## 2013-12-27 MED ORDER — PRAVASTATIN SODIUM 80 MG PO TABS: ORAL_TABLET | ORAL | Status: DC

## 2013-12-27 MED ORDER — LOSARTAN POTASSIUM-HCTZ 50-12.5 MG PO TABS: ORAL_TABLET | ORAL | Status: DC

## 2013-12-27 NOTE — Progress Notes (Signed)
   Subjective:    Patient ID: Breanna Gomez, female    DOB: 06/25/1960, 53 y.o.   MRN: [id number]  Hypertension    Hypertension- Pt denies chest pain, SOB, dizziness, or heart palpitations.  Taking meds as directed w/o problems.  Denies medication side effects.    GAD- She is still taking the citalpram 20mg .  She is not taking 40mg . Still uses her lorazepam prn.  She is feeling some better overall. She really doesn't want to increase her dose.    She still complains of feeling like she worries too much and feeling afraid that something awful might happen more than half the days. She still complains of some irritability occasionally as well as feeling nervous occasionally.  Review of Systems     Objective:   Physical Exam  Constitutional: She is oriented to person, place, and time. She appears well-developed and well-nourished.  HENT:  Head: Normocephalic and atraumatic.  Cardiovascular: Normal rate, regular rhythm and normal heart sounds.   Pulmonary/Chest: Effort normal and breath sounds normal.  Neurological: She is alert and oriented to person, place, and time.  Skin: Skin is warm and dry.  Psychiatric: She has a normal mood and affect. Her behavior is normal.          Assessment & Plan:  HTN - well controlled.  Continue current regimen. Follow up in 6 months.  GAD - GAD- 7 score of 8.  She really doesn't want to change her medication.  Strongly encouraged her to think about it. Certainly if she feels like her mood is worsening she can give me a call and we can adjust her regimen. Otherwise followup in 4-6 months.

## 2014-02-18 ENCOUNTER — Other Ambulatory Visit: Payer: Self-pay | Admitting: Family Medicine

## 2014-04-19 ENCOUNTER — Ambulatory Visit (INDEPENDENT_AMBULATORY_CARE_PROVIDER_SITE_OTHER): Payer: 59 | Admitting: Physician Assistant

## 2014-04-19 ENCOUNTER — Encounter: Payer: Self-pay | Admitting: Physician Assistant

## 2014-04-19 VITALS — BP 127/79 | HR 92 | Temp 97.5°F | Ht 65.0 in | Wt 167.0 lb

## 2014-04-19 DIAGNOSIS — J0100 Acute maxillary sinusitis, unspecified: Secondary | ICD-10-CM

## 2014-04-19 DIAGNOSIS — J208 Acute bronchitis due to other specified organisms: Secondary | ICD-10-CM

## 2014-04-19 MED ORDER — AZITHROMYCIN 250 MG PO TABS: ORAL_TABLET | ORAL | Status: DC

## 2014-04-19 MED ORDER — ALBUTEROL SULFATE HFA 108 (90 BASE) MCG/ACT IN AERS: 2.0000 | INHALATION_SPRAY | Freq: Four times a day (QID) | RESPIRATORY_TRACT | Status: DC | PRN

## 2014-04-19 MED ORDER — HYDROCODONE-HOMATROPINE 5-1.5 MG/5ML PO SYRP: 5.0000 mL | ORAL_SOLUTION | Freq: Three times a day (TID) | ORAL | Status: DC | PRN

## 2014-04-19 NOTE — Progress Notes (Signed)
   Subjective:    Patient ID: Breanna Gomez, female    DOB: 08/02/1960, 53 y.o.   MRN: [id number]  HPI Pt presents to the clinic with cough, congestion and sinus pressure. Cough is productive with yellowish sputum. She is a smoker. She does have sinus pressure off and on. Symptoms persistent for approximately 1 week. Tried decongestants and mucinex with little relief. No fever, chills, n/v/d. No ST. Ear has pressure. No SOB or wheezing.   Review of Systems  All other systems reviewed and are negative.      Objective:   Physical Exam  Constitutional: She is oriented to person, place, and time. She appears well-developed and well-nourished.  HENT:  Head: Normocephalic and atraumatic.  Right Ear: External ear normal.  Left Ear: External ear normal.  TM's present with bilateral erythema and some air bubbles.  Some pressure over maxillary sinuses to palpation.  oropharyx is erythematous with some PND.  Nasal turbinated red, swollen with rhinorrhea.   Eyes: Conjunctivae are normal. Right eye exhibits no discharge. Left eye exhibits no discharge.  Neck: Normal range of motion. Neck supple.  Cardiovascular: Normal rate, regular rhythm and normal heart sounds.   Pulmonary/Chest: Effort normal and breath sounds normal.  Coarse breath sounds bilaterally.  No wheezing.   Lymphadenopathy:    She has no cervical adenopathy.  Neurological: She is alert and oriented to person, place, and time.  Skin: Skin is dry.  Psychiatric: She has a normal mood and affect. Her behavior is normal.          Assessment & Plan:  Acute bronchitis/Acute sinusitis-  zpak given for 5 days. Hycodan for cough at bedtime. Continue mucinex twice a day. Consider flonase 2 sprays each nostril. Follow up as needed.

## 2014-05-02 ENCOUNTER — Other Ambulatory Visit: Payer: Self-pay | Admitting: Family Medicine

## 2014-05-15 ENCOUNTER — Ambulatory Visit (INDEPENDENT_AMBULATORY_CARE_PROVIDER_SITE_OTHER): Payer: 59 | Admitting: Family Medicine

## 2014-05-15 ENCOUNTER — Encounter: Payer: Self-pay | Admitting: Family Medicine

## 2014-05-15 VITALS — BP 145/80 | HR 96 | Temp 97.7°F | Wt 166.0 lb

## 2014-05-15 DIAGNOSIS — R1011 Right upper quadrant pain: Secondary | ICD-10-CM

## 2014-05-15 LAB — COMPLETE METABOLIC PANEL WITH GFR
ALK PHOS: 66 U/L (ref 39–117)
ALT: 17 U/L (ref 0–35)
AST: 17 U/L (ref 0–37)
Albumin: 4.4 g/dL (ref 3.5–5.2)
BUN: 17 mg/dL (ref 6–23)
CALCIUM: 10.3 mg/dL (ref 8.4–10.5)
CHLORIDE: 100 meq/L (ref 96–112)
CO2: 28 mEq/L (ref 19–32)
Creat: 0.86 mg/dL (ref 0.50–1.10)
GFR, EST NON AFRICAN AMERICAN: 76 mL/min
GFR, Est African American: 88 mL/min
GLUCOSE: 111 mg/dL — AB (ref 70–99)
POTASSIUM: 4.7 meq/L (ref 3.5–5.3)
Sodium: 137 mEq/L (ref 135–145)
Total Bilirubin: 0.5 mg/dL (ref 0.2–1.2)
Total Protein: 7.4 g/dL (ref 6.0–8.3)

## 2014-05-15 LAB — LIPASE: Lipase: 22 U/L (ref 0–75)

## 2014-05-15 LAB — GAMMA GT: GGT: 13 U/L (ref 7–51)

## 2014-05-15 NOTE — Progress Notes (Signed)
CC: Breanna Gomez is a 54 y.o. female is here for Back Pain   Subjective: HPI:  Abdominal pain that radiated into her back between the shoulder blades that began on Friday evening. Interventions included Tums, ranitidine, and a shot of liquor none of which seem to make a big improvement however over 5 hours symptoms improved enough to where she could fall asleep. It was accompanied by nausea. Nothing particularly made the symptoms better or worse, there was no positional or exertional component to worsening or improving the pain. Since then it has remained "dull"in severity. She tells me her reflux seems to be a little bit worse over the past week or 2 but otherwise other than above she's in her normal state of health. Currently nothing is making her pain better or worse. Today she localizes it in the right upper quadrant and radiating between shoulder blades. No fevers, chills, cough, shortness of breath, wheezing, nor blood in stool.   Review Of Systems Outlined In HPI  Past Medical History  Diagnosis Date  . Goiter, euthyroid     Past Surgical History  Procedure Laterality Date  . Tonsillectomy  54 yrs old  . Breast surgery      cyst removes from r breast  . Removed cyst from tailbone      removed cyst    Family History  Problem Relation Age of Onset  . Hypertension Mother   . Hyperlipidemia Mother   . Alzheimer's disease Mother   . Hyperlipidemia Father   . Hypertension Father   . Diabetes Father   . Hyperlipidemia Sister   . Hypertension Sister   . Diabetes Sister     History   Social History  . Marital Status: Married    Spouse Name: N/A    Number of Children: 2  . Years of Education: N/A   Occupational History  .     Social History Main Topics  . Smoking status: Current Every Day Smoker -- 0.50 packs/day for 30 years    Types: Cigarettes  . Smokeless tobacco: Not on file  . Alcohol Use: No  . Drug Use: No  . Sexual Activity:    Partners: Male   Other  Topics Concern  . Not on file   Social History Narrative   No regular exercise.  Daily caffeine intake.      Objective: BP 145/80 mmHg  Pulse 96  Temp(Src) 97.7 F (36.5 C) (Oral)  Wt 166 lb (75.297 kg)  LMP 05/12/2000  General: Alert and Oriented, No Acute Distress HEENT: Pupils equal, round, reactive to light. Conjunctivae clear.  Moist mucous membranes pharynx unremarkable Lungs: Clear to auscultation bilaterally, no wheezing/ronchi/rales.  Comfortable work of breathing. Good air movement. Cardiac: Regular rate and rhythm. Normal S1/S2.  No murmurs, rubs, nor gallops.   Abdomen: Normal bowel sounds, soft and non tender without palpable masses. No rebound guarding nor rigidity Extremities: No peripheral edema.  Strong peripheral pulses.  Mental Status: No depression, anxiety, nor agitation. Skin: Warm and dry.  Assessment & Plan: Breanna Gomez was seen today for back pain.  Diagnoses and associated orders for this visit:  RUQ pain - Gamma GT - COMPLETE METABOLIC PANEL WITH GFR - Lipase    Right upper quadrant pain: She has a history of gallstones and her pain is highly suspicious for cholecystitis therefore checking labs above rule out pancreatitis. Encouraged her to continue taking omeprazole however if the above labs are normal will treat as gastritis/gastric ulcer through process of  elimination.  Return if symptoms worsen or fail to improve.

## 2014-05-16 ENCOUNTER — Telehealth: Payer: Self-pay | Admitting: Family Medicine

## 2014-05-16 MED ORDER — PANTOPRAZOLE SODIUM 40 MG PO TBEC: 40.0000 mg | DELAYED_RELEASE_TABLET | Freq: Every day | ORAL | Status: DC

## 2014-05-16 NOTE — Telephone Encounter (Signed)
Pt notified and [facility]

## 2014-05-16 NOTE — Telephone Encounter (Signed)
Breanna Gomez, Will you please let patient know that liver function, gallbladder function, and pancreatic function were all normal.  I'd recommend she switch from omeprazole to pantoprazole to help reduce inflammation to the lining of her stomach which I suspect caused her abdominal and back discomfort. (Rx in your inbox since e-Rx is not working this morning). F/U with her PCP Dr. Judie PetitM in 2-3 weeks.

## 2014-06-05 ENCOUNTER — Other Ambulatory Visit: Payer: Self-pay | Admitting: Family Medicine

## 2014-06-06 ENCOUNTER — Ambulatory Visit (INDEPENDENT_AMBULATORY_CARE_PROVIDER_SITE_OTHER): Payer: 59 | Admitting: Family Medicine

## 2014-06-06 ENCOUNTER — Encounter: Payer: Self-pay | Admitting: Family Medicine

## 2014-06-06 VITALS — BP 119/68 | HR 71 | Ht 65.0 in | Wt 168.0 lb

## 2014-06-06 DIAGNOSIS — R1011 Right upper quadrant pain: Secondary | ICD-10-CM

## 2014-06-06 DIAGNOSIS — R079 Chest pain, unspecified: Secondary | ICD-10-CM

## 2014-06-06 DIAGNOSIS — K2100 Gastro-esophageal reflux disease with esophagitis, without bleeding: Secondary | ICD-10-CM

## 2014-06-06 MED ORDER — LOSARTAN POTASSIUM-HCTZ 50-12.5 MG PO TABS: ORAL_TABLET | ORAL | Status: DC

## 2014-06-06 MED ORDER — CITALOPRAM HYDROBROMIDE 20 MG PO TABS: 20.0000 mg | ORAL_TABLET | Freq: Every day | ORAL | Status: DC

## 2014-06-06 MED ORDER — LORAZEPAM 0.5 MG PO TABS: ORAL_TABLET | ORAL | Status: DC

## 2014-06-06 MED ORDER — SUCRALFATE 1 GM/10ML PO SUSP: 1.0000 g | Freq: Three times a day (TID) | ORAL | Status: DC

## 2014-06-06 NOTE — Patient Instructions (Signed)

## 2014-06-06 NOTE — Progress Notes (Signed)
   Subjective:    Patient ID: Breanna Gomez, female    DOB: 07/23/1960, 54 y.o.   MRN: [id number]  HPI Was seen about 3 weeks ago for RUQ pain that was radiating to her back.  Started after eating a hamburger. Thought is was reflux at first.  Had a head and chest cold most of December. She is still having some discomfort in the mid chest but now having some pain on the left side of chest.  She is now drinking decaf.  Still having lots of reflux.  Worse in AM and at bedtime.    Review of Systems     Objective:   Physical Exam  Constitutional: She is oriented to person, place, and time. She appears well-developed and well-nourished.  HENT:  Head: Normocephalic and atraumatic.  Cardiovascular: Normal rate, regular rhythm and normal heart sounds.   Pulmonary/Chest: Effort normal and breath sounds normal.  Abdominal: Soft. Bowel sounds are normal. She exhibits no distension and no mass. There is tenderness. There is no rebound and no guarding.  TTP in the right lateal upper quad and mildly over the epigastrum.   Neurological: She is alert and oriented to person, place, and time.  Skin: Skin is warm and dry.  Psychiatric: She has a normal mood and affect. Her behavior is normal.          Assessment & Plan:  RUQ pain - I would like to repeat her ultrasound she had one done about 2 years ago that showed a stone versus a polyp. Like to investigate this further. She is tender in the right upper quadrant today. EKG today shows normal sinus rhythm with a rate of 80 bpm, no acute ST-T wave changes.  GERD - symptoms have not been well controlled with switching from omeprazole to pantoprazole. Will add Carafate before each meal and at bedtime for temporary symptom relief. Additional information and handout provided on reflux and dietary measures.  Left-sided chest pain-it does not sound cardiac in nature. EKG was reassuring. Most of her pain is really focused over the esophageal area. I think  getting her reflux under better control will help.

## 2014-06-12 ENCOUNTER — Other Ambulatory Visit: Payer: 59

## 2014-06-30 ENCOUNTER — Ambulatory Visit (INDEPENDENT_AMBULATORY_CARE_PROVIDER_SITE_OTHER): Payer: 59

## 2014-06-30 DIAGNOSIS — R1011 Right upper quadrant pain: Secondary | ICD-10-CM

## 2014-07-03 ENCOUNTER — Telehealth: Payer: Self-pay | Admitting: *Deleted

## 2014-07-03 NOTE — Telephone Encounter (Signed)
[phone] (Home) [phone] (Mobile)  Left VM for patient to give us a call back concerning referral to GI.

## 2014-07-03 NOTE — Telephone Encounter (Signed)
-----   Message from Agapito Gamesatherine D Metheney, MD sent at 07/02/2014  9:44 PM EST ----- Call pt"GB does show a polyp or stone.  Since having pain in the RUQ, I would like to refer to hr GI to get opinion about wether or not might need  gb removed.

## 2014-08-26 ENCOUNTER — Other Ambulatory Visit: Payer: Self-pay | Admitting: Family Medicine

## 2014-10-06 ENCOUNTER — Encounter: Payer: Self-pay | Admitting: Family Medicine

## 2014-10-06 ENCOUNTER — Ambulatory Visit (INDEPENDENT_AMBULATORY_CARE_PROVIDER_SITE_OTHER): Payer: 59 | Admitting: Family Medicine

## 2014-10-06 VITALS — BP 133/83 | HR 86 | Wt 166.0 lb

## 2014-10-06 DIAGNOSIS — J00 Acute nasopharyngitis [common cold]: Secondary | ICD-10-CM | POA: Diagnosis not present

## 2014-10-06 DIAGNOSIS — J208 Acute bronchitis due to other specified organisms: Principal | ICD-10-CM

## 2014-10-06 DIAGNOSIS — B9689 Other specified bacterial agents as the cause of diseases classified elsewhere: Secondary | ICD-10-CM

## 2014-10-06 MED ORDER — AZITHROMYCIN 250 MG PO TABS: ORAL_TABLET | ORAL | Status: AC

## 2014-10-06 MED ORDER — PREDNISONE 20 MG PO TABS: ORAL_TABLET | ORAL | Status: AC

## 2014-10-06 MED ORDER — HYDROCODONE-HOMATROPINE 5-1.5 MG/5ML PO SYRP: 5.0000 mL | ORAL_SOLUTION | Freq: Three times a day (TID) | ORAL | Status: DC | PRN

## 2014-10-06 NOTE — Progress Notes (Signed)
CC: Breanna SallesSherri R Gomez is a 54 y.o. female is here for Cough   Subjective: HPI:  Nonproductive cough present on a daily basis for the last 1-2 weeks. It seems to be worse when outside or when lying down at night. She's tried a handful of over-the-counter cough and cold medications without much benefit. Symptoms are worse at night and are keeping her awake at night. Accompanied by wheezing. No blood in sputum. No chest pain review of systems positive for mild shortness of breath with exertion. Sensation of postnasal drip but no nasal congestion or ear pain or facial pain. Denies fevers, chills, abdominal pain.   Review Of Systems Outlined In HPI  Past Medical History  Diagnosis Date  . Goiter, euthyroid     Past Surgical History  Procedure Laterality Date  . Tonsillectomy  54 yrs old  . Breast surgery      cyst removes from r breast  . Removed cyst from tailbone      removed cyst    Family History  Problem Relation Age of Onset  . Hypertension Mother   . Hyperlipidemia Mother   . Alzheimer's disease Mother   . Hyperlipidemia Father   . Hypertension Father   . Diabetes Father   . Hyperlipidemia Sister   . Hypertension Sister   . Diabetes Sister     History   Social History  . Marital Status: Married    Spouse Name: N/A  . Number of Children: 2  . Years of Education: N/A   Occupational History  .     Social History Main Topics  . Smoking status: Current Every Day Smoker -- 0.50 packs/day for 30 years    Types: Cigarettes  . Smokeless tobacco: Not on file  . Alcohol Use: No  . Drug Use: No  . Sexual Activity:    Partners: Male   Other Topics Concern  . Not on file   Social History Narrative   No regular exercise.  Daily caffeine intake.      Objective: BP 133/83 mmHg  Pulse 86  Wt 166 lb (75.297 kg)  SpO2 95%  LMP 05/12/2000  General: Alert and Oriented, No Acute Distress HEENT: Pupils equal, round, reactive to light. Conjunctivae clear.  External ears  unremarkable, canals clear with intact TMs with appropriate landmarks.  Middle ear appears open without effusion. Pink inferior turbinates.  Moist mucous membranes, pharynx without inflammation nor lesions.  Neck supple without palpable lymphadenopathy nor abnormal masses. Lungs: Comfortable work of breathing with end expiratory wheezing mild in severity on the right upper posterior lung field Extremities: No peripheral edema.  Strong peripheral pulses.  Mental Status: No depression, anxiety, nor agitation. Skin: Warm and dry.  Assessment & Plan: Breanna Gomez was seen today for cough.  Diagnoses and all orders for this visit:  Acute bacterial bronchitis Orders: -     predniSONE (DELTASONE) 20 MG tablet; Three tabs at once daily for five days. -     azithromycin (ZITHROMAX) 250 MG tablet; Take two tabs at once on day 1, then one tab daily on days 2-5.  Other orders -     HYDROcodone-homatropine (HYCODAN) 5-1.5 MG/5ML syrup; Take 5 mLs by mouth every 8 (eight) hours as needed for cough.   Bacterial bronchitis, suspect acute on chronic bronchitis in this long-time smoker. Start prednisone and azithromycin. Urged smoking cessation.  Return if symptoms worsen or fail to improve.

## 2014-11-09 ENCOUNTER — Other Ambulatory Visit: Payer: Self-pay | Admitting: Family Medicine

## 2014-12-19 ENCOUNTER — Ambulatory Visit (INDEPENDENT_AMBULATORY_CARE_PROVIDER_SITE_OTHER): Payer: 59 | Admitting: Family Medicine

## 2014-12-19 ENCOUNTER — Encounter: Payer: Self-pay | Admitting: Family Medicine

## 2014-12-19 VITALS — BP 123/72 | HR 74 | Ht 65.0 in | Wt 168.0 lb

## 2014-12-19 DIAGNOSIS — E785 Hyperlipidemia, unspecified: Secondary | ICD-10-CM

## 2014-12-19 DIAGNOSIS — Z720 Tobacco use: Secondary | ICD-10-CM | POA: Diagnosis not present

## 2014-12-19 DIAGNOSIS — Z114 Encounter for screening for human immunodeficiency virus [HIV]: Secondary | ICD-10-CM | POA: Diagnosis not present

## 2014-12-19 DIAGNOSIS — Z0189 Encounter for other specified special examinations: Secondary | ICD-10-CM | POA: Diagnosis not present

## 2014-12-19 DIAGNOSIS — Z1159 Encounter for screening for other viral diseases: Secondary | ICD-10-CM

## 2014-12-19 DIAGNOSIS — Z0000 Encounter for general adult medical examination without abnormal findings: Secondary | ICD-10-CM

## 2014-12-19 DIAGNOSIS — F17200 Nicotine dependence, unspecified, uncomplicated: Secondary | ICD-10-CM

## 2014-12-19 LAB — LIPID PANEL
CHOLESTEROL: 213 mg/dL — AB (ref 125–200)
HDL: 45 mg/dL — ABNORMAL LOW (ref >=46)
LDL Cholesterol: 133 mg/dL — ABNORMAL HIGH (ref <130)
TRIGLYCERIDES: 174 mg/dL — AB (ref <150)
Total CHOL/HDL Ratio: 4.7 Ratio (ref <=5.0)
VLDL: 35 mg/dL — AB (ref <30)

## 2014-12-19 LAB — COMPLETE METABOLIC PANEL WITH GFR
ALK PHOS: 67 U/L (ref 33–130)
ALT: 14 U/L (ref 6–29)
AST: 15 U/L (ref 10–35)
Albumin: 4 g/dL (ref 3.6–5.1)
BILIRUBIN TOTAL: 0.5 mg/dL (ref 0.2–1.2)
BUN: 16 mg/dL (ref 7–25)
CALCIUM: 9.5 mg/dL (ref 8.6–10.4)
CHLORIDE: 104 mmol/L (ref 98–110)
CO2: 29 mmol/L (ref 20–31)
CREATININE: 0.78 mg/dL (ref 0.50–1.05)
GFR, Est Non African American: 86 mL/min (ref >=60)
GLUCOSE: 103 mg/dL — AB (ref 65–99)
Potassium: 4.5 mmol/L (ref 3.5–5.3)
Sodium: 138 mmol/L (ref 135–146)
Total Protein: 6.5 g/dL (ref 6.1–8.1)

## 2014-12-19 MED ORDER — LORAZEPAM 0.5 MG PO TABS: ORAL_TABLET | ORAL | Status: DC

## 2014-12-19 MED ORDER — CITALOPRAM HYDROBROMIDE 20 MG PO TABS: 20.0000 mg | ORAL_TABLET | Freq: Every day | ORAL | Status: DC

## 2014-12-19 MED ORDER — PRAVASTATIN SODIUM 80 MG PO TABS: ORAL_TABLET | ORAL | Status: DC

## 2014-12-19 NOTE — Progress Notes (Signed)
  Subjective:     Breanna Gomez is a 54 y.o. female and is here for a comprehensive physical exam. The patient reports no problems.  History   Social History  . Marital Status: Married    Spouse Name: N/A  . Number of Children: 2  . Years of Education: N/A   Occupational History  .     Social History Main Topics  . Smoking status: Current Every Day Smoker -- 0.50 packs/day for 30 years    Types: Cigarettes  . Smokeless tobacco: Not on file  . Alcohol Use: No  . Drug Use: No  . Sexual Activity:    Partners: Male   Other Topics Concern  . Not on file   Social History Narrative   No regular exercise.  Daily caffeine intake.    Health Maintenance  Topic Date Due  . Hepatitis C Screening  [birth year]  . HIV Screening  01/10/1974  . COLONOSCOPY  10/24/2014  . INFLUENZA VACCINE  12/11/2014  . MAMMOGRAM  09/03/2015  . PAP SMEAR  09/23/2015  . TETANUS/TDAP  09/15/2018    The following portions of the patient's history were reviewed and updated as appropriate: allergies, current medications, past family history, past medical history, past social history, past surgical history and problem list.  Review of Systems A comprehensive review of systems was negative.   Objective:    BP 123/72 mmHg  Pulse 74  Ht  (1.651 m)  Wt 168 lb (76.204 kg)  BMI 27.96 kg/m2  LMP 05/12/2000 General appearance: alert, cooperative and appears stated age Head: Normocephalic, without obvious abnormality, atraumatic Eyes: conj clear, EOMI, PEERLA Ears: normal TM's and external ear canals both ears Nose: Nares normal. Septum midline. Mucosa normal. No drainage or sinus tenderness. Throat: lips, mucosa, and tongue normal; teeth and gums normal Neck: no adenopathy, no carotid bruit, no JVD, supple, symmetrical, trachea midline and thyroid not enlarged, symmetric, no tenderness/mass/nodules Back: symmetric, no curvature. ROM normal. No CVA tenderness. Lungs: clear to auscultation  bilaterally Breasts: normal appearance, no masses or tenderness Heart: regular rate and rhythm, S1, S2 normal, no murmur, click, rub or gallop Abdomen: soft, non-tender; bowel sounds normal; no masses,  no organomegaly Extremities: extremities normal, atraumatic, no cyanosis or edema Pulses: 2+ and symmetric Skin: Skin color, texture, turgor normal. No rashes or lesions Lymph nodes: Cervical, supraclavicular, and axillary nodes normal. Neurologic: Alert and oriented X 3, normal strength and tone. Normal symmetric reflexes. Normal coordination and gait    Assessment:    Healthy female exam.      Plan:     See After Visit Summary for Counseling Recommendations  Keep up a regular exercise program and make sure you are eating a healthy diet Try to eat 4 servings of dairy a day, or if you are lactose intolerant take a calcium with vitamin D daily.  Your vaccines are up to date.  Reminded due for colonoscopy.    Encouraged smoking cessation. Discussed nicotine replacement. Coupon given.

## 2014-12-19 NOTE — Patient Instructions (Addendum)
Keep up a regular exercise program and make sure you are eating a healthy diet Try to eat 4 servings of dairy a day, or if you are lactose intolerant take a calcium with vitamin D daily.  Your vaccines are up to date.    [facility], [place], [place].    [place]

## 2014-12-20 LAB — HEPATITIS C ANTIBODY: HCV AB: NEGATIVE

## 2014-12-20 LAB — HIV ANTIBODY (ROUTINE TESTING W REFLEX): HIV: NONREACTIVE

## 2015-01-11 ENCOUNTER — Other Ambulatory Visit: Payer: Self-pay | Admitting: Physician Assistant

## 2015-03-05 ENCOUNTER — Other Ambulatory Visit: Payer: Self-pay | Admitting: Family Medicine

## 2015-03-14 ENCOUNTER — Telehealth: Payer: Self-pay | Admitting: Family Medicine

## 2015-03-14 NOTE — Telephone Encounter (Signed)
Received fax for prior authorization on Chantix continuing month sent through cover my meds waiting on authorization. - CF

## 2015-03-22 NOTE — Telephone Encounter (Signed)
[facility] Denied due to no documented failure os Nicoderm, Habitrol, or Thrive.   Will give information to PCP for review.

## 2015-03-26 ENCOUNTER — Telehealth: Payer: Self-pay | Admitting: Family Medicine

## 2015-03-26 NOTE — Telephone Encounter (Signed)
Received fax for prior authorization on Chantix continuing month pak sent through cover my meds waiting on authorization. - CF

## 2015-04-03 ENCOUNTER — Telehealth: Payer: Self-pay | Admitting: Family Medicine

## 2015-04-03 NOTE — Telephone Encounter (Signed)
Received fax from [employer] and they denied coverage on Chantix due to patient has to try Habitrol OTC, Nicoderm OTC, Nicorette gum OTC, Nicorette Lozenge OTC, Thrive gum OTC, Thrive Lozenge OTC. File ID [id number]. - CF

## 2015-04-03 NOTE — Telephone Encounter (Signed)
Please call patient and let her know that her insurance will not pay for Chantix. They will cover nicotine replacement which includes NicoDerm CQ which is a patch, Nicorette gum, Nicorette lozenges, the Thrive lozenge and thrive gone. If she is interested in any of these products then please let me know and we can send a new prescription to her pharmacy.

## 2015-04-04 NOTE — Telephone Encounter (Signed)
Left detailed msg with # for questions.

## 2015-04-28 ENCOUNTER — Other Ambulatory Visit: Payer: Self-pay | Admitting: Family Medicine

## 2015-05-01 ENCOUNTER — Encounter: Payer: Self-pay | Admitting: Family Medicine

## 2015-05-01 ENCOUNTER — Ambulatory Visit (INDEPENDENT_AMBULATORY_CARE_PROVIDER_SITE_OTHER): Payer: 59 | Admitting: Family Medicine

## 2015-05-01 VITALS — BP 138/68 | HR 86 | Wt 170.0 lb

## 2015-05-01 DIAGNOSIS — M546 Pain in thoracic spine: Secondary | ICD-10-CM

## 2015-05-01 MED ORDER — METHYLPREDNISOLONE ACETATE 40 MG/ML IJ SUSP
40.0000 mg | Freq: Once | INTRAMUSCULAR | Status: AC
  Administered 2015-05-01: 40 mg via INTRAMUSCULAR

## 2015-05-01 NOTE — Progress Notes (Signed)
Subjective:    Patient ID: Breanna Gomez, female    DOB: 29-Oct-1960, 54 y.o.   MRN: [id number]  HPI Patient comes in for a new problem today complaining of mid back pain. She was helping lift a couch about a month ago. She does not remember having sudden pain but says it was after that that she started to know some gradual back pain underneath the bra strap area. She says at times it feels like it's a tearing type pain. No radiation. No numbness or tingling. She notices it most when she bends over to reach or when she reaches up to get something off of a shelf. She did try taking her husband's diclofenac but said it caused severe abdominal pain. She are he has an intolerance to NSAIDs.   Review of Systems  BP 138/68 mmHg  Pulse 86  Wt 170 lb (77.111 kg)  SpO2 97%  LMP 05/12/2000    Allergies  Allergen Reactions  . Nsaids Other (See Comments)    GI ulcers.     Past Medical History  Diagnosis Date  . Goiter, euthyroid     Past Surgical History  Procedure Laterality Date  . Tonsillectomy  54 yrs old  . Breast surgery      cyst removes from r breast  . Removed cyst from tailbone      removed cyst     Social History   Social History  . Marital Status: Married    Spouse Name: N/A  . Number of Children: 2  . Years of Education: N/A   Occupational History  .     Social History Main Topics  . Smoking status: Current Every Day Smoker -- 0.50 packs/day for 40 years    Types: Cigarettes  . Smokeless tobacco: Not on file  . Alcohol Use: No  . Drug Use: No  . Sexual Activity:    Partners: Male   Other Topics Concern  . Not on file   Social History Narrative   No regular exercise.  Daily caffeine intake.     Family History  Problem Relation Age of Onset  . Hypertension Mother   . Hyperlipidemia Mother   . Alzheimer's disease Mother   . Hyperlipidemia Father   . Hypertension Father   . Diabetes Father   . Hyperlipidemia Sister   . Hypertension Sister   .  Diabetes Sister     Outpatient Encounter Prescriptions as of 05/01/2015  Medication Sig  . aspirin 81 MG tablet Take 81 mg by mouth daily.    . Biotin 1 MG CAPS Take 1 tablet by mouth daily.  . citalopram (CELEXA) 20 MG tablet Take 1 tablet (20 mg total) by mouth daily.  Marland Kitchen LORazepam (ATIVAN) 0.5 MG tablet TAKE ONE TABLET TWICE DAILY AS NEEDED FOR ANXIETY  . losartan-hydrochlorothiazide (HYZAAR) 50-12.5 MG tablet Take 1 tablet by mouth daily. APPOINTMENT NEEDED FOR FURTHER REFILLS (Patient taking differently: Take 1 tablet by mouth daily. )  . Melatonin 1 MG TABS Take 1 tablet by mouth at bedtime as needed.  . pantoprazole (PROTONIX) 40 MG tablet TAKE ONE TABLET BY MOUTH EVERY DAY  . pravastatin (PRAVACHOL) 80 MG tablet TAKE 1 TABLET AT BEDTIME.  . VENTOLIN HFA 108 (90 BASE) MCG/ACT inhaler Inhale 2 puffs into the lungs every 6 (six) hours as needed for wheezing.  . cyclobenzaprine (FLEXERIL) 10 MG tablet Take 1 tablet (10 mg total) by mouth at bedtime as needed. (Patient not taking: Reported on 05/01/2015)  . [  DISCONTINUED] CHANTIX CONTINUING MONTH PAK 1 MG tablet TAKE ONE TABLET TWICE DAILY  . [EXPIRED] methylPREDNISolone acetate (DEPO-MEDROL) injection 40 mg    No facility-administered encounter medications on file as of 05/01/2015.          Objective:   Physical Exam  Constitutional: She is oriented to person, place, and time. She appears well-developed and well-nourished.  HENT:  Head: Normocephalic and atraumatic.  Eyes: Conjunctivae and EOM are normal.  Cardiovascular: Normal rate.   Pulmonary/Chest: Effort normal.  Musculoskeletal:  Nontender over the thoracic spine. She does have a lot of spasm and tightness on the right paraspinous muscles in the mid thoracic area. Normal range of motion.  Neurological: She is alert and oriented to person, place, and time.  Skin: Skin is dry. No pallor.  Psychiatric: She has a normal mood and affect. Her behavior is normal.  Vitals  reviewed.         Assessment & Plan:  Mid back pain-work on stretches. Recommend heat or ice, whichever feels better. She is unable to tolerate NSAIDs orally we'll try Depo-Medrol 40 mg IM injection for inflammation. Muscle relaxers make her groggy so did not prescribe this today. If she's not feeling some better in the next 2-3 weeks and give please give us a call back.

## 2015-07-18 ENCOUNTER — Encounter: Payer: Self-pay | Admitting: Family Medicine

## 2015-07-18 ENCOUNTER — Other Ambulatory Visit: Payer: Self-pay | Admitting: Family Medicine

## 2015-07-18 ENCOUNTER — Ambulatory Visit (INDEPENDENT_AMBULATORY_CARE_PROVIDER_SITE_OTHER): Payer: 59 | Admitting: Family Medicine

## 2015-07-18 VITALS — BP 136/67 | HR 79 | Ht 65.0 in | Wt 173.0 lb

## 2015-07-18 DIAGNOSIS — J0100 Acute maxillary sinusitis, unspecified: Secondary | ICD-10-CM

## 2015-07-18 MED ORDER — AMOXICILLIN-POT CLAVULANATE 875-125 MG PO TABS: 1.0000 | ORAL_TABLET | Freq: Two times a day (BID) | ORAL | Status: DC

## 2015-07-18 MED ORDER — METHYLPREDNISOLONE ACETATE 80 MG/ML IJ SUSP
80.0000 mg | Freq: Once | INTRAMUSCULAR | Status: AC
  Administered 2015-07-18: 80 mg via INTRAMUSCULAR

## 2015-07-18 NOTE — Progress Notes (Signed)
   Subjective:    Patient ID: Breanna SallesSherri R Gomez, female    DOB: 06/27/1960, 55 y.o.   MRN: [id number]  HPI    Review of Systems     Objective:   Physical Exam        Assessment & Plan:

## 2015-07-18 NOTE — Progress Notes (Signed)
   Subjective:    Patient ID: Breanna Gomez, female    DOB: 09/08/1960, 55 y.o.   MRN: [id number]  HPI 6 days of Sinus congestion and pressure. She's also started getting a dry barking cough over the last couple of days. No fevers chills or sweats. She just hasn't felt well and has had a hard time concentrating. She's had a lot of facial pressure behind her eyes and in the back of her head. Does think some of this is allergy triggered but has been taking her generic Allegra consistently. She says she really doesn't do well with nasal steroid sprays they cause nosebleeds. Using Vicks Vapor Rub     Review of Systems     Objective:   Physical Exam  Constitutional: She is oriented to person, place, and time. She appears well-developed and well-nourished.  HENT:  Head: Normocephalic and atraumatic.  Right Ear: External ear normal.  Left Ear: External ear normal.  Nose: Nose normal.  Mouth/Throat: Oropharynx is clear and moist.  TMs and canals are clear.   Eyes: Conjunctivae and EOM are normal. Pupils are equal, round, and reactive to light.  Neck: Neck supple. No thyromegaly present.  Cardiovascular: Normal rate, regular rhythm and normal heart sounds.   Pulmonary/Chest: Effort normal and breath sounds normal. She has no wheezes.  Lymphadenopathy:    She has no cervical adenopathy.  Neurological: She is alert and oriented to person, place, and time.  Skin: Skin is warm and dry.  Psychiatric: She has a normal mood and affect.          Assessment & Plan:  Acute sinusitis with allergic rhinitis-we'll go ahead and treat with 80 mg of IM Depo-Medrol for 2 relief. If she's not feeling better by the weekend and okay to fill the antibiotic. Because of history of GI ulcers she really is intolerant of NSAIDs and oral prednisone.

## 2015-07-18 NOTE — Addendum Note (Signed)
Addended by: Deno EtienneBARKLEY, Abeera Flannery L on: 07/18/2015 03:11 PM   Modules accepted: Orders

## 2015-07-18 NOTE — Patient Instructions (Signed)
Ok to fill the antibiotic this weekend if needed.

## 2015-10-12 ENCOUNTER — Other Ambulatory Visit: Payer: Self-pay | Admitting: Family Medicine

## 2015-11-23 LAB — HM COLONOSCOPY

## 2015-11-26 ENCOUNTER — Encounter: Payer: Self-pay | Admitting: Family Medicine

## 2015-12-07 LAB — HM MAMMOGRAPHY

## 2015-12-13 ENCOUNTER — Encounter: Payer: Self-pay | Admitting: *Deleted

## 2016-01-07 ENCOUNTER — Encounter: Payer: Self-pay | Admitting: Family Medicine

## 2016-01-07 ENCOUNTER — Ambulatory Visit (INDEPENDENT_AMBULATORY_CARE_PROVIDER_SITE_OTHER): Payer: 59 | Admitting: Family Medicine

## 2016-01-07 VITALS — BP 150/69 | HR 73 | Ht 60.0 in | Wt 160.0 lb

## 2016-01-07 DIAGNOSIS — K529 Noninfective gastroenteritis and colitis, unspecified: Secondary | ICD-10-CM

## 2016-01-07 DIAGNOSIS — Z23 Encounter for immunization: Secondary | ICD-10-CM

## 2016-01-07 DIAGNOSIS — I10 Essential (primary) hypertension: Secondary | ICD-10-CM

## 2016-01-07 DIAGNOSIS — Z0000 Encounter for general adult medical examination without abnormal findings: Secondary | ICD-10-CM

## 2016-01-07 DIAGNOSIS — E348 Other specified endocrine disorders: Secondary | ICD-10-CM

## 2016-01-07 DIAGNOSIS — R1011 Right upper quadrant pain: Secondary | ICD-10-CM

## 2016-01-07 DIAGNOSIS — Z720 Tobacco use: Secondary | ICD-10-CM

## 2016-01-07 LAB — CBC WITH DIFFERENTIAL/PLATELET
BASOS ABS: 0 {cells}/uL (ref 0–200)
Basophils Relative: 0 %
Eosinophils Absolute: 206 cells/uL (ref 15–500)
Eosinophils Relative: 2 %
HEMATOCRIT: 44.4 % (ref 35.0–45.0)
Hemoglobin: 15.1 g/dL (ref 11.7–15.5)
LYMPHS PCT: 31 %
Lymphs Abs: 3193 cells/uL (ref 850–3900)
MCH: 30.5 pg (ref 27.0–33.0)
MCHC: 34 g/dL (ref 32.0–36.0)
MCV: 89.7 fL (ref 80.0–100.0)
MPV: 9.1 fL (ref 7.5–12.5)
Monocytes Absolute: 515 cells/uL (ref 200–950)
Monocytes Relative: 5 %
NEUTROS ABS: 6386 {cells}/uL (ref 1500–7800)
NEUTROS PCT: 62 %
Platelets: 355 10*3/uL (ref 140–400)
RBC: 4.95 MIL/uL (ref 3.80–5.10)
RDW: 13 % (ref 11.0–15.0)
WBC: 10.3 10*3/uL (ref 3.8–10.8)

## 2016-01-07 LAB — COMPLETE METABOLIC PANEL WITH GFR
ALBUMIN: 4.3 g/dL (ref 3.6–5.1)
ALK PHOS: 59 U/L (ref 33–130)
ALT: 13 U/L (ref 6–29)
AST: 14 U/L (ref 10–35)
BUN: 16 mg/dL (ref 7–25)
CALCIUM: 10 mg/dL (ref 8.6–10.4)
CO2: 27 mmol/L (ref 20–31)
Chloride: 101 mmol/L (ref 98–110)
Creat: 0.74 mg/dL (ref 0.50–1.05)
Glucose, Bld: 100 mg/dL — ABNORMAL HIGH (ref 65–99)
POTASSIUM: 4.9 mmol/L (ref 3.5–5.3)
Sodium: 137 mmol/L (ref 135–146)
Total Bilirubin: 0.5 mg/dL (ref 0.2–1.2)
Total Protein: 6.8 g/dL (ref 6.1–8.1)

## 2016-01-07 LAB — LIPID PANEL
CHOL/HDL RATIO: 3.7 ratio (ref <=5.0)
CHOLESTEROL: 189 mg/dL (ref 125–200)
HDL: 51 mg/dL (ref >=46)
LDL Cholesterol: 110 mg/dL (ref <130)
Triglycerides: 139 mg/dL (ref <150)
VLDL: 28 mg/dL (ref <30)

## 2016-01-07 LAB — POCT GLYCOSYLATED HEMOGLOBIN (HGB A1C): HEMOGLOBIN A1C: 5.8

## 2016-01-07 LAB — TSH: TSH: 1.5 m[IU]/L

## 2016-01-07 NOTE — Progress Notes (Signed)
Subjective:     Breanna Gomez is a 55 y.o. female and is here for a comprehensive physical exam. The patient reports problems - diarrhea x 2 weeks with loose stools and incontinence at night while sleeping..She says really she's had diarrhea and loose stools on and off for years but over the last 2 weeks it's gotten worse. She says stress differently seems to trigger it. She did go visit her daughter in [place] and went out to eat and feels like that's when her symptoms got worse. She says she thinks she had a gallbladder attack after eating fried chicken. She started to feel very nauseated and had some right upper quadrant pain. She has had a gallbladder ultrasound previously which showed a polyp.  Tobacco abuse-she would like to retry Wellbutrin. She tried in the nicotine gum says it really aggravated her reflux and made her nauseated.  Social History   Social History  . Marital status: Married    Spouse name: N/A  . Number of children: 2  . Years of education: N/A   Occupational History  .  [employer]   Social History Main Topics  . Smoking status: Current Every Day Smoker    Packs/day: 0.50    Years: 40.00    Types: Cigarettes  . Smokeless tobacco: Not on file  . Alcohol use No  . Drug use: No  . Sexual activity: Yes    Partners: Male   Other Topics Concern  . Not on file   Social History Narrative   No regular exercise.  Daily caffeine intake.    Health Maintenance  Topic Date Due  . INFLUENZA VACCINE  12/11/2015  . PAP SMEAR  09/21/2016 (Originally 09/23/2015)  . MAMMOGRAM  12/06/2017  . TETANUS/TDAP  09/15/2018  . COLONOSCOPY  11/23/2018  . Hepatitis C Screening  Completed  . HIV Screening  Completed    The following portions of the patient's history were reviewed and updated as appropriate: allergies, current medications, past family history, past medical history, past social history, past surgical history and problem list.  Review of Systems A  comprehensive review of systems was negative.   Objective:    BP (!) 150/69 (BP Location: Left Arm, Patient Position: Sitting, Cuff Size: Normal)   Pulse 73   Ht 5' (1.524 m)   Wt 160 lb (72.6 kg)   LMP 05/12/2000   SpO2 98%   BMI 31.25 kg/m  General appearance: alert, cooperative and appears stated age Head: Normocephalic, without obvious abnormality, atraumatic Eyes: conj clear, EOMI, PEERLA Ears: normal TM's and external ear canals both ears Nose: Nares normal. Septum midline. Mucosa normal. No drainage or sinus tenderness. Throat: lips, mucosa, and tongue normal; teeth and gums normal Neck: no adenopathy, no carotid bruit, no JVD, supple, symmetrical, trachea midline and thyroid not enlarged, symmetric, no tenderness/mass/nodules Back: symmetric, no curvature. ROM normal. No CVA tenderness. Lungs: clear to auscultation bilaterally Heart: regular rate and rhythm, S1, S2 normal, no murmur, click, rub or gallop Abdomen: soft, non-tender; bowel sounds normal; no masses,  no organomegaly Extremities: extremities normal, atraumatic, no cyanosis or edema Pulses: 2+ and symmetric Skin: Skin color, texture, turgor normal. No rashes or lesions Lymph nodes: Cervical, supraclavicular, and axillary nodes normal. Neurologic: Alert and oriented X 3, normal strength and tone. Normal symmetric reflexes. Normal coordination and gait    Assessment:    Healthy female exam.      Plan:     See After Visit Summary for Counseling  Recommendations   Keep up a regular exercise program and make sure you are eating a healthy diet Try to eat 4 servings of dairy a day, or if you are lactose intolerant take a calcium with vitamin D daily.  Your vaccines are up to date.   Tobacco abuse-she would like to retry Wellbutrin. She tried in the nicotine gum says it really aggravated her reflux and made her nauseated.New perception sent for Wellbutrin.  Diarrhea-I suspect she probably actually has IBS -D but  since her symptoms have been worse over the last couple of weeks we will do a stool culture & check for C. Difficile.If negative then consider treatment for IBS, diarrhea predominant.    RUQ pain - had US 1.5 yr ago with polyp vs stone.  Will plan to repeat US.     IFG - A1c still in the prediabetic range but down to 5.8 which is fantastic. We'll continue to keep an eye on this every 6-12 months. Lab Results  Component Value Date   HGBA1C 5.8 01/07/2016

## 2016-01-07 NOTE — Patient Instructions (Signed)
Keep up a regular exercise program and make sure you are eating a healthy diet Try to eat 4 servings of dairy a day, or if you are lactose intolerant take a calcium with vitamin D daily.  Your vaccines are up to date.   

## 2016-01-08 NOTE — Progress Notes (Signed)
All labs are normal. 

## 2016-01-11 ENCOUNTER — Telehealth: Payer: Self-pay | Admitting: Family Medicine

## 2016-01-11 MED ORDER — BUPROPION HCL ER (XL) 150 MG PO TB24: 150.0000 mg | ORAL_TABLET | ORAL | 2 refills | Status: DC

## 2016-01-11 NOTE — Telephone Encounter (Signed)
Sent wellbutrin 

## 2016-01-11 NOTE — Telephone Encounter (Signed)
Pt states an Rx was supposed to be sent in for Wellbutrin to [facility]. No Rx was written. Will route.

## 2016-01-18 ENCOUNTER — Ambulatory Visit (INDEPENDENT_AMBULATORY_CARE_PROVIDER_SITE_OTHER): Payer: 59

## 2016-01-18 DIAGNOSIS — R932 Abnormal findings on diagnostic imaging of liver and biliary tract: Secondary | ICD-10-CM

## 2016-02-16 ENCOUNTER — Other Ambulatory Visit: Payer: Self-pay | Admitting: Family Medicine

## 2016-03-24 ENCOUNTER — Other Ambulatory Visit: Payer: Self-pay | Admitting: Family Medicine

## 2016-04-23 ENCOUNTER — Other Ambulatory Visit: Payer: Self-pay | Admitting: Family Medicine

## 2016-04-25 ENCOUNTER — Other Ambulatory Visit: Payer: Self-pay | Admitting: Physician Assistant

## 2016-06-04 ENCOUNTER — Other Ambulatory Visit: Payer: Self-pay | Admitting: Family Medicine

## 2016-11-17 ENCOUNTER — Ambulatory Visit (INDEPENDENT_AMBULATORY_CARE_PROVIDER_SITE_OTHER): Payer: 59

## 2016-11-17 ENCOUNTER — Encounter: Payer: Self-pay | Admitting: Family Medicine

## 2016-11-17 ENCOUNTER — Ambulatory Visit (INDEPENDENT_AMBULATORY_CARE_PROVIDER_SITE_OTHER): Payer: 59 | Admitting: Family Medicine

## 2016-11-17 VITALS — BP 126/64 | HR 78 | Wt 156.0 lb

## 2016-11-17 DIAGNOSIS — M25562 Pain in left knee: Secondary | ICD-10-CM

## 2016-11-17 DIAGNOSIS — G8929 Other chronic pain: Secondary | ICD-10-CM | POA: Diagnosis not present

## 2016-11-17 DIAGNOSIS — M25462 Effusion, left knee: Secondary | ICD-10-CM | POA: Diagnosis not present

## 2016-11-17 MED ORDER — PREDNISONE 20 MG PO TABS: 40.0000 mg | ORAL_TABLET | Freq: Every day | ORAL | 0 refills | Status: DC

## 2016-11-17 MED ORDER — DICLOFENAC SODIUM 1 % TD GEL: 4.0000 g | Freq: Four times a day (QID) | TRANSDERMAL | 4 refills | Status: DC

## 2016-11-17 NOTE — Progress Notes (Signed)
Subjective:    Patient ID: Breanna Gomez, female    DOB: [birth year], 58 y.o.   MRN: [id number]  HPI 56 yo female says she feel in April ( x 3 mo) on her left knee. Taking Tylenol, As she is unable to take NSAIDs because of GI upset.. Pain is 5-6/10 and aching. Some lateral leg pain running down the leg as well and it has gotten worse since the fall. Uses a knee brace occassaionally and that help. It bothers her particularly at night. She says it doesn't keep her really from doing anything though.   Review of Systems  BP 126/64   Pulse 78   Wt 156 lb (70.8 kg)   LMP 05/12/2000   SpO2 99%   BMI 30.47 kg/m     Allergies  Allergen Reactions  . Nsaids Other (See Comments)    GI ulcers.     Past Medical History:  Diagnosis Date  . Goiter, euthyroid     Past Surgical History:  Procedure Laterality Date  . BREAST SURGERY     cyst removes from r breast  . removed cyst from tailbone     removed cyst   . TONSILLECTOMY  56 yrs old    Social History   Social History  . Marital status: Married    Spouse name: N/A  . Number of children: 2  . Years of education: N/A   Occupational History  .  [employer]   Social History Main Topics  . Smoking status: Current Every Day Smoker    Packs/day: 0.50    Years: 40.00    Types: Cigarettes  . Smokeless tobacco: Never Used  . Alcohol use No  . Drug use: No  . Sexual activity: Yes    Partners: Male   Other Topics Concern  . Not on file   Social History Narrative   No regular exercise.  Daily caffeine intake.     Family History  Problem Relation Age of Onset  . Hypertension Mother   . Hyperlipidemia Mother   . Alzheimer's disease Mother   . Hyperlipidemia Father   . Hypertension Father   . Diabetes Father   . Hyperlipidemia Sister   . Hypertension Sister   . Diabetes Sister     Outpatient Encounter Prescriptions as of 11/17/2016  Medication Sig  . aspirin 81 MG tablet Take 81 mg by mouth daily.    Marland Kitchen buPROPion  (WELLBUTRIN XL) 150 MG 24 hr tablet Take 1 tablet (150 mg total) by mouth every morning.  . citalopram (CELEXA) 20 MG tablet TAKE 1 TABLET BY MOUTH EVERY DAY  . LORazepam (ATIVAN) 0.5 MG tablet TAKE 1 TABLET TWICE DAILY AS NEEDED FOR ANXIETY  . losartan-hydrochlorothiazide (HYZAAR) 50-12.5 MG tablet Take 1 tablet by mouth daily.  . Melatonin 1 MG TABS Take 1 tablet by mouth at bedtime as needed.  . pantoprazole (PROTONIX) 40 MG tablet TAKE ONE TABLET BY MOUTH EVERY DAY  . pravastatin (PRAVACHOL) 80 MG tablet TAKE 1 TABLET BY MOUTH AT BEDTIME  . VENTOLIN HFA 108 (90 Base) MCG/ACT inhaler inhale TWO PUFFS EVERY 6 HOURS AS NEEDED FOR WHEEZING  . diclofenac sodium (VOLTAREN) 1 % GEL Apply 4 g topically 4 (four) times daily. For knee osteoarthritis  . predniSONE (DELTASONE) 20 MG tablet Take 2 tablets (40 mg total) by mouth daily.   No facility-administered encounter medications on file as of 11/17/2016.           Objective:   Physical  Exam  Constitutional: She is oriented to person, place, and time. She appears well-developed and well-nourished.  HENT:  Head: Normocephalic and atraumatic.  Eyes: Conjunctivae and EOM are normal.  Cardiovascular: Normal rate.   Pulmonary/Chest: Effort normal.  Musculoskeletal:  Left knee with no swelling or edema. Normal flexion and extension. No increased laxity with anterior drawer test. Nontender along the joint lines. She is tender just along the did it on the lateral part of the nevus on the patella. Nontender over the patellar tendon. Negative McMurray's test. Strength at the knee and ankle Sonata 5.  Neurological: She is alert and oriented to person, place, and time.  Skin: Skin is dry. No pallor.  Psychiatric: She has a normal mood and affect. Her behavior is normal.  Vitals reviewed.           Assessment & Plan:  Left knee pain that's just lateral to the patella along the edge of the joint line. Recommend x-ray just to make sure that she  didn't chip a piece of bone. I suspect she probably also has some arthritis in that joint. They're nontender on knee exam today. Recommend a trial of oral prednisone.  She will need to take it with a PPI or Zantac since she has sensitivity to NSAIDs. We can also try a topical NSAID which I think await great option for her. Call results once available. We cannot refer to sports medicine if needed.

## 2016-11-24 ENCOUNTER — Ambulatory Visit (INDEPENDENT_AMBULATORY_CARE_PROVIDER_SITE_OTHER): Payer: 59 | Admitting: Family Medicine

## 2016-11-24 ENCOUNTER — Encounter: Payer: Self-pay | Admitting: Family Medicine

## 2016-11-24 VITALS — BP 123/68 | HR 75 | Ht 60.0 in | Wt 154.0 lb

## 2016-11-24 DIAGNOSIS — M79662 Pain in left lower leg: Secondary | ICD-10-CM | POA: Insufficient documentation

## 2016-11-24 DIAGNOSIS — M25562 Pain in left knee: Secondary | ICD-10-CM | POA: Diagnosis not present

## 2016-11-24 NOTE — Progress Notes (Signed)
   Subjective:    I'm seeing this patient as a consultation for:  Agapito GamesMetheney, Catherine D, MD   CC: Left knee pain  HPI: Patient notes ongoing left anterior knee pain. She notes that she fell landing on her anterior knee in April. She's had pain before that but it worsened following that accident. She has the pain was worsening recently. She was seen by her PCP on July 9 where an x-ray showed a mild effusion. She was prescribed prednisone which has helped a lot. She denies any locking or catching or giving way. She notes the pain is worse with activity and better with rest but it will throb at times and interferes with sleep. She denies any significant radiating pain weakness or numbness. Additionally she was prescribed topical diclofenac gel which has helped. She notes she cannot tolerate oral NSAIDs.  Past medical history, Surgical history, Family history not pertinant except as noted below, Social history, Allergies, and medications have been entered into the medical record, reviewed, and no changes needed.   Review of Systems: No headache, visual changes, nausea, vomiting, diarrhea, constipation, dizziness, abdominal pain, skin rash, fevers, chills, night sweats, weight loss, swollen lymph nodes, body aches, joint swelling, muscle aches, chest pain, shortness of breath, mood changes, visual or auditory hallucinations.   Objective:    Vitals:   11/24/16 1515  BP: 123/68  Pulse: 75   General: Well Developed, well nourished, and in no acute distress.  Neuro/Psych: Alert and oriented x3, extra-ocular muscles intact, able to move all 4 extremities, sensation grossly intact. Skin: Warm and dry, no rashes noted.  Respiratory: Not using accessory muscles, speaking in full sentences, trachea midline.  Cardiovascular: Pulses palpable, no extremity edema. Abdomen: Does not appear distended. MSK: Left knee is unremarkable. With no effusion or erythema. Range of motion 0-120 with no significant  crepitations. Mildly tender to palpation at the anterior aspect of the knee just lateral of the patellar tendon. Stable ligamentous exam. Negative McMurray's testing. Intact flexion and extension strength.   X-ray left knee dated 11/17/2016 reviewed  No results found for this or any previous visit (from the past 24 hour(s)). No results found.  Impression and Recommendations:    Assessment and Plan: 56 y.o. female with Left knee pain with unclear etiology. Patient has exacerbation of chronic knee pain. The etiology of this time is somewhat unclear. X-ray does not show severe DJD. Regardless she seems to be doing pretty well with a short course of oral prednisone and topical diclofenac gel. I recommend adding quadricep strengthening activities. General to perform his home exercises. Additionally recommend exercise cycling which will certainly help quad strength.  I also recommend continuing topical diclofenac gel as it seems to be helpful. We discussed the pros and cons of viscous supplementation or further imaging such as MRI.  Patient would like to wait on all of these treatments or diagnostic modalities. Plan to recheck in 6 weeks.   No orders of the defined types were placed in this encounter.  No orders of the defined types were placed in this encounter.   Discussed warning signs or symptoms. Please see discharge instructions. Patient expresses understanding.

## 2016-11-24 NOTE — Patient Instructions (Signed)
Thank you for coming in today. Please work on the exercises we discussed.  Continue the gel 4x daily.  Let me know if you want to the gel shots.   Recheck in 6 weeks.

## 2016-12-01 ENCOUNTER — Ambulatory Visit (INDEPENDENT_AMBULATORY_CARE_PROVIDER_SITE_OTHER): Payer: 59 | Admitting: Family Medicine

## 2016-12-01 ENCOUNTER — Encounter: Payer: Self-pay | Admitting: Family Medicine

## 2016-12-01 VITALS — BP 134/54 | HR 68 | Ht 60.0 in | Wt 156.0 lb

## 2016-12-01 DIAGNOSIS — M5431 Sciatica, right side: Secondary | ICD-10-CM | POA: Diagnosis not present

## 2016-12-01 MED ORDER — PREDNISONE 20 MG PO TABS: 40.0000 mg | ORAL_TABLET | Freq: Every day | ORAL | 0 refills | Status: DC

## 2016-12-01 NOTE — Progress Notes (Signed)
Subjective:    Patient ID: Breanna Gomez, female    DOB: 12/28/1960, 56 y.o.   MRN: [id number]  HPI 56 year old female who was recently seen for knee pain comes in today for acute low back pain with radicular symptoms on the right.  Started about 2 weeks ago after I saw her and has been getting worse. She is on some prednisone around that time and thinks about the time she completed prednisone his pain actually got worse. The day before it started she had been bending of the time washing her dog who is pre-small overall. She doesn't remember any specific injury per se. She just woke up the next day and it felt sore. She is getting pain radiating down to her right buttock and into the Route right outer thigh. It does not pass the knee. The pain in her back feels more sharp worse the pain in her thigh feels more dull. She's not currently taking any medications for it.   Review of Systems   BP (!) 134/54   Pulse 68   Ht 5' (1.524 m)   Wt 156 lb (70.8 kg)   LMP 05/12/2000   BMI 30.47 kg/m     Allergies  Allergen Reactions  . Nsaids Other (See Comments)    GI ulcers.     Past Medical History:  Diagnosis Date  . Goiter, euthyroid     Past Surgical History:  Procedure Laterality Date  . BREAST SURGERY     cyst removes from r breast  . removed cyst from tailbone     removed cyst   . TONSILLECTOMY  56 yrs old    Social History   Social History  . Marital status: Married    Spouse name: N/A  . Number of children: 2  . Years of education: N/A   Occupational History  .  [employer]   Social History Main Topics  . Smoking status: Current Every Day Smoker    Packs/day: 0.50    Years: 40.00    Types: Cigarettes  . Smokeless tobacco: Never Used  . Alcohol use No  . Drug use: No  . Sexual activity: Yes    Partners: Male   Other Topics Concern  . Not on file   Social History Narrative   No regular exercise.  Daily caffeine intake.     Family History  Problem  Relation Age of Onset  . Hypertension Mother   . Hyperlipidemia Mother   . Alzheimer's disease Mother   . Hyperlipidemia Father   . Hypertension Father   . Diabetes Father   . Hyperlipidemia Sister   . Hypertension Sister   . Diabetes Sister     Outpatient Encounter Prescriptions as of 12/01/2016  Medication Sig  . aspirin 81 MG tablet Take 81 mg by mouth daily.    Marland Kitchen. buPROPion (WELLBUTRIN XL) 150 MG 24 hr tablet Take 1 tablet (150 mg total) by mouth every morning.  . citalopram (CELEXA) 20 MG tablet TAKE 1 TABLET BY MOUTH EVERY DAY  . diclofenac sodium (VOLTAREN) 1 % GEL Apply 4 g topically 4 (four) times daily. For knee osteoarthritis  . LORazepam (ATIVAN) 0.5 MG tablet TAKE 1 TABLET TWICE DAILY AS NEEDED FOR ANXIETY  . losartan-hydrochlorothiazide (HYZAAR) 50-12.5 MG tablet Take 1 tablet by mouth daily.  . Melatonin 1 MG TABS Take 1 tablet by mouth at bedtime as needed.  . pantoprazole (PROTONIX) 40 MG tablet TAKE ONE TABLET BY MOUTH EVERY DAY  .  pravastatin (PRAVACHOL) 80 MG tablet TAKE 1 TABLET BY MOUTH AT BEDTIME  . VENTOLIN HFA 108 (90 Base) MCG/ACT inhaler inhale TWO PUFFS EVERY 6 HOURS AS NEEDED FOR WHEEZING  . predniSONE (DELTASONE) 20 MG tablet Take 2 tablets (40 mg total) by mouth daily.   No facility-administered encounter medications on file as of 12/01/2016.           Objective:   Physical Exam  Constitutional: She is oriented to person, place, and time. She appears well-developed and well-nourished.  HENT:  Head: Normocephalic and atraumatic.  Eyes: Conjunctivae and EOM are normal.  Cardiovascular: Normal rate.   Pulmonary/Chest: Effort normal.  Musculoskeletal:  Decreased lumbar flexion. Normal extension. Symmetric rotation right and left and symmetric side bending. Negative straight leg raise bilaterally. Hip, knee, ankle strength is 5 out of 5.  Patellar reflexes 2+.   Neurological: She is alert and oriented to person, place, and time.  Skin: Skin is dry.  No pallor.  Psychiatric: She has a normal mood and affect. Her behavior is normal.  Vitals reviewed.     Assessment & Plan:  Right low back pain with radiculopathy/sciatica-we'll put her back on 5 days of prednisone. Refer to formal physical therapy for further treatment. Xray not indicated at this time.

## 2016-12-01 NOTE — Patient Instructions (Addendum)
Sciatica Rehab  Ask your health care provider which exercises are safe for you. Do exercises exactly as told by your health care provider and adjust them as directed. It is normal to feel mild stretching, pulling, tightness, or discomfort as you do these exercises, but you should stop right away if you feel sudden pain or your pain gets worse. Do not begin these exercises until told by your health care provider.  Stretching and range of motion exercises  These exercises warm up your muscles and joints and improve the movement and flexibility of your hips and your back. These exercises also help to relieve pain, numbness, and tingling.  Exercise A: Sciatic nerve glide  1. Sit in a chair with your head facing down toward your chest. Place your hands behind your back. Let your shoulders slump forward.  2. Slowly straighten one of your knees while you tilt your head back as if you are looking toward the ceiling. Only straighten your leg as far as you can without making your symptoms worse.  3. Hold for __________ seconds.  4. Slowly return to the starting position.  5. Repeat with your other leg.  Repeat __________ times. Complete this exercise __________ times a day.  Exercise B: Knee to chest with hip adduction and internal rotation    1. Lie on your back on a firm surface with both legs straight.  2. Bend one of your knees and move it up toward your chest until you feel a gentle stretch in your lower back and buttock. Then, move your knee toward the shoulder that is on the opposite side from your leg.  ? Hold your leg in this position by holding onto the front of your knee.  3. Hold for __________ seconds.  4. Slowly return to the starting position.  5. Repeat with your other leg.  Repeat __________ times. Complete this exercise __________ times a day.  Exercise C: Prone extension on elbows    1. Lie on your abdomen on a firm surface. A bed may be too soft for this exercise.  2. Prop yourself up on your  elbows.  3. Use your arms to help lift your chest up until you feel a gentle stretch in your abdomen and your lower back.  ? This will place some of your body weight on your elbows. If this is uncomfortable, try stacking pillows under your chest.  ? Your hips should stay down, against the surface that you are lying on. Keep your hip and back muscles relaxed.  4. Hold for __________ seconds.  5. Slowly relax your upper body and return to the starting position.  Repeat __________ times. Complete this exercise __________ times a day.  Strengthening exercises  These exercises build strength and endurance in your back. Endurance is the ability to use your muscles for a long time, even after they get tired.  Exercise D: Pelvic tilt  1. Lie on your back on a firm surface. Bend your knees and keep your feet flat.  2. Tense your abdominal muscles. Tip your pelvis up toward the ceiling and flatten your lower back into the floor.  ? To help with this exercise, you may place a small towel under your lower back and try to push your back into the towel.  3. Hold for __________ seconds.  4. Let your muscles relax completely before you repeat this exercise.  Repeat __________ times. Complete this exercise __________ times a day.  Exercise E: Alternating arm and leg raises      1. Get on your hands and knees on a firm surface. If you are on a hard floor, you may want to use padding to cushion your knees, such as an exercise mat.  2. Line up your arms and legs. Your hands should be below your shoulders, and your knees should be below your hips.  3. Lift your left leg behind you. At the same time, raise your right arm and straighten it in front of you.  ? Do not lift your leg higher than your hip.  ? Do not lift your arm higher than your shoulder.  ? Keep your abdominal and back muscles tight.  ? Keep your hips facing the ground.  ? Do not arch your back.  ? Keep your balance carefully, and do not hold your breath.  4. Hold for  __________ seconds.  5. Slowly return to the starting position and repeat with your right leg and your left arm.  Repeat __________ times. Complete this exercise __________ times a day.  Posture and body mechanics    Body mechanics refers to the movements and positions of your body while you do your daily activities. Posture is part of body mechanics. Good posture and healthy body mechanics can help to relieve stress in your body's tissues and joints. Good posture means that your spine is in its natural S-curve position (your spine is neutral), your shoulders are pulled back slightly, and your head is not tipped forward. The following are general guidelines for applying improved posture and body mechanics to your everyday activities.  Standing    · When standing, keep your spine neutral and your feet about hip-width apart. Keep a slight bend in your knees. Your ears, shoulders, and hips should line up.  · When you do a task in which you stand in one place for a long time, place one foot up on a stable object that is 2-4 inches (5-10 cm) high, such as a footstool. This helps keep your spine neutral.  Sitting    · When sitting, keep your spine neutral and keep your feet flat on the floor. Use a footrest, if necessary, and keep your thighs parallel to the floor. Avoid rounding your shoulders, and avoid tilting your head forward.  · When working at a desk or a computer, keep your desk at a height where your hands are slightly lower than your elbows. Slide your chair under your desk so you are close enough to maintain good posture.  · When working at a computer, place your monitor at a height where you are looking straight ahead and you do not have to tilt your head forward or downward to look at the screen.  Resting    · When lying down and resting, avoid positions that are most painful for you.  · If you have pain with activities such as sitting, bending, stooping, or squatting (flexion-based activities), lie in a  position in which your body does not bend very much. For example, avoid curling up on your side with your arms and knees near your chest (fetal position).  · If you have pain with activities such as standing for a long time or reaching with your arms (extension-based activities), lie with your spine in a neutral position and bend your knees slightly. Try the following positions:  ? Lying on your side with a pillow between your knees.  ? Lying on your back with a pillow under your knees.  Lifting    · When lifting   objects, keep your feet at least shoulder-width apart and tighten your abdominal muscles.  · Bend your knees and hips and keep your spine neutral. It is important to lift using the strength of your legs, not your back. Do not lock your knees straight out.  · Always ask for help to lift heavy or awkward objects.  This information is not intended to replace advice given to you by your health care provider. Make sure you discuss any questions you have with your health care provider.  Document Released: 04/28/2005 Document Revised: 01/03/2016 Document Reviewed: 01/12/2015  Elsevier Interactive Patient Education © 2018 [employer]

## 2016-12-02 ENCOUNTER — Telehealth: Payer: Self-pay

## 2016-12-02 MED ORDER — TRAMADOL HCL 50 MG PO TABS: 50.0000 mg | ORAL_TABLET | Freq: Three times a day (TID) | ORAL | 0 refills | Status: DC | PRN

## 2016-12-02 NOTE — Telephone Encounter (Signed)
Pt reports that her pain is worse this morning and is requesting something for pain. Please advise. -EH/RMA

## 2016-12-02 NOTE — Telephone Encounter (Signed)
Pt notified -EH/RMA  

## 2016-12-02 NOTE — Telephone Encounter (Signed)
Sent over 10 days of tramadol. Please make sure that physical therapy has contacted her.

## 2016-12-03 ENCOUNTER — Ambulatory Visit (INDEPENDENT_AMBULATORY_CARE_PROVIDER_SITE_OTHER): Payer: 59 | Admitting: Physical Therapy

## 2016-12-03 ENCOUNTER — Encounter: Payer: Self-pay | Admitting: Physical Therapy

## 2016-12-03 DIAGNOSIS — M5441 Lumbago with sciatica, right side: Secondary | ICD-10-CM

## 2016-12-03 NOTE — Therapy (Signed)
[facility] [address] [place], [place], [address] Phone: [phone]   Fax:  [phone]  Physical Therapy Evaluation  Patient Details  Name: Breanna SallesSherri R Kirkendoll MRN: [id number] Date of Birth: 11/14/1960 Referring Provider: Dr Linford ArnoldMetheney  Encounter Date: 12/03/2016      PT End of Session - 12/03/16 0847    Visit Number 1   Number of Visits 12   Date for PT Re-Evaluation 01/14/17   PT Start Time 0847   PT Stop Time 0936   PT Time Calculation (min) 49 min   Activity Tolerance Patient limited by pain      Past Medical History:  Diagnosis Date  . Goiter, euthyroid     Past Surgical History:  Procedure Laterality Date  . BREAST SURGERY     cyst removes from r breast  . removed cyst from tailbone     removed cyst   . TONSILLECTOMY  56 yrs old    There were no vitals filed for this visit.       Subjective Assessment - 12/03/16 0850    Subjective Pt reports she woke up with back and Rt leg pain 2 wks ago after giving her beagle a bath, leaning over the tub.  She was on prednisone for her knees at that time, once she went off the prednisone the pain became worse. She had an injection for pain and it made it worse.  She is back on prednisone now as she is unable to take anit-anti -inflammatories.    Currently in Pain? Yes   Pain Score 10-Worst pain ever   Pain Location Back   Pain Orientation Right   Pain Descriptors / Indicators Burning;Shooting   Pain Type Acute pain   Pain Radiating Towards into Rt lateral thigh   Pain Onset 1 to 4 weeks ago   Pain Frequency Constant   Aggravating Factors  everything   Pain Relieving Factors nothing over the last week.             [facility] - 12/03/16 0001      Assessment   Medical Diagnosis Rt sided sciatica   Referring Provider Dr Linford ArnoldMetheney   Onset Date/Surgical Date 11/19/16   Hand Dominance Left   Next MD Visit PRN is going to schedule one   Prior  Therapy none     Precautions   Precautions None     Balance Screen   Has the patient fallen in the past 6 months Yes   How many times? 1  April on the ice - this is what caused her knee pain   Has the patient had a decrease in activity level because of a fear of falling?  No   Is the patient reluctant to leave their home because of a fear of falling?  No     Home Environment   Living Environment Private residence   Living Arrangements Spouse/significant other  unable to provide physical assist   Home Layout Two level  into basement     Prior Function   Level of Independence --  needs assit with dressing right now   Vocation Full time employment   Vocation Requirements office work, desk job   Leisure spend time with family     Observation/Other Assessments   Focus on Therapeutic Outcomes (FOTO)  90% limited     Functional Tests   Functional tests --  not assess due to level of pain     Posture/Postural Control  Posture/Postural Control Postural limitations   Postural Limitations Rounded Shoulders;Forward head;Increased lumbar lordosis;Flexed trunk;Weight shift left     ROM / Strength   AROM / PROM / Strength --  Not assesses due to level of pain     Flexibility   Soft Tissue Assessment /Muscle Length --  not assessed due to level of pain     Ambulation/Gait   Gait Pattern Step-to pattern;Decreased step length - left;Decreased stance time - right;Decreased hip/knee flexion - right;Right hip hike;Antalgic;Trunk flexed            Objective measurements completed on examination: See above findings.          [facility] Adult PT Treatment/Exercise - 12/03/16 0001      Self-Care   Self-Care ADL's   ADL's positioning with sleep, bed mobility and limit twisting     Exercises   Exercises Lumbar     Lumbar Exercises: Stretches   Prone on Elbows Stretch --  5'   Press Ups --  10 reps     Lumbar Exercises: Sidelying   Other Sidelying Lumbar Exercises Lt s/l  over rolled up towel to open up Rt side of the spine     Modalities   Modalities Traction;Cryotherapy     Cryotherapy   Number Minutes Cryotherapy 15 Minutes   Cryotherapy Location Lumbar Spine  during traction   Type of Cryotherapy Ice pack     Traction   Type of Traction Lumbar  in prone   Min (lbs) 25   Max (lbs) 35   Hold Time 60   Rest Time 20   Time 15                PT Education - 12/03/16 1056    Education provided Yes   Education Details HEP and mechanics of disc pressure on nerves   Person(s) Educated Patient;Other (comment)  sister was present   Methods Explanation;Demonstration;Handout   Comprehension Returned demonstration;Verbalized understanding             PT Long Term Goals - 12/03/16 1422      PT LONG TERM GOAL #1   Title I with advanced HEP    Time 6   Period Weeks   Status New   Target Date 01/14/17     PT LONG TERM GOAL #2   Title tolerate sitting to allow her to return to work    Time 6   Period Weeks   Status New   Target Date 01/14/17     PT LONG TERM GOAL #3   Title improve FOTO =/< 53% limited    Time 6   Period Weeks   Status New   Target Date 01/14/17     PT LONG TERM GOAL #4   Title to be established after further assessment                 Plan - 12/03/16 1057    Clinical Impression Statement Ameila presented in a lot of pain for evaluation. Her pain is into the Rt thigh and limits her ability to move, walk and tolerate full evaluation.  She is unable to work at this time and requiring assistance with ADLs.  The session was spent on finding positions/exercises to decrease her pain.  She will need her ROM/strength looked at next visit.    Clinical Presentation Unstable   Clinical Decision Making Moderate   Rehab Potential Good   PT Frequency 2x / week   PT Duration 6  weeks   PT Treatment/Interventions Moist Heat;Traction;Ultrasound;Therapeutic exercise;Dry needling;Manual  techniques;Taping;Neuromuscular re-education;Cryotherapy;Electrical Stimulation;Iontophoresis 4mg /ml Dexamethasone;Patient/family education   PT Next Visit Plan see if extension based exercise are still helping, increase traction pull and look at lumbar ROM, LE and core strength.    Consulted and Agree with Plan of Care Patient;Family member/caregiver   Family Member Consulted sister      Patient will benefit from skilled therapeutic intervention in order to improve the following deficits and impairments:  Decreased range of motion, Difficulty walking, Pain, Decreased activity tolerance, Postural dysfunction, Decreased mobility  Visit Diagnosis: Acute right-sided low back pain with right-sided sciatica - Plan: PT plan of care cert/re-cert     Problem List Patient Active Problem List   Diagnosis Date Noted  . Acute pain of left knee 11/24/2016  . HYPERCALCEMIA 05/10/2010  . DISORDER, TOBACCO USE 01/25/2007  . ANXIETY DISORDER, GENERALIZED 11/19/2006  . MICROSCOPIC HEMATURIA 08/28/2006  . MAMMOGRAM, ABNORMAL 08/28/2006  . GOITER NOS 07/29/2006  . HYPERTENSION, BENIGN ESSENTIAL 07/29/2006  . POSTMENOPAUSAL STATUS 07/29/2006  . INSULIN RESISTANCE SYNDROME 06/29/2006  . HYPERLIPIDEMIA 06/29/2006    Roderic ScarceSusan Laconda Basich PT  12/03/2016, 2:26 PM  [facility] [address] [place], [place], [address] Phone: [phone]   Fax:  [phone]  Name: Breanna SallesSherri R Palinkas MRN: [id number] Date of Birth: 05/05/1961

## 2016-12-03 NOTE — Patient Instructions (Addendum)
Trunk: Prone Extension (Press-Ups) - don't have to lift this high    Lie on stomach on firm, flat surface. Relax bottom and legs. Raise chest in air with elbows straight. Keep hips flat on surface, sag stomach. Hold __1__ seconds. Repeat _5-10___ times. Do _multiple times a day. ___ sessions per day. CAUTION: Movement should be gentle and slow. .  On Elbows (Prone)    Rise up on elbows as high as possible, keeping hips on floor. Hold _60-180___ seconds. Repeat __1__ times per set. Do ____ sets per session. Do _multiple times during the day___ sessions per day.  Lumbar Rotation: with Side-Bend - Bolster (Side-Lying)    On left side, bolster at hip crest level, top leg bent __45-60__ degrees, rotate upper torso back until stretch is felt can also reach right arm overhead for more of a stretch. Hold __60-180__ seconds. Relax. Repeat __1__ times per set. Do ____ sets per session. Do _multiple times during the day. ___ sessions per day.   Backward Bend (Standing)    Arch backward to make hollow of back deeper. Hold _2-3___ seconds. Repeat __5__ times per set. Do ____ sets per session. Do __multiple times through out the day__ sessions per day.     Sleeping on Side    Place pillow between knees. Use cervical support under neck and a roll around waist as needed.  Sleeping on Back    Place pillow under knees. A pillow with cervical support and a roll around waist are also helpful.   Getting Into / Out of Bed    Lower self to lie down on one side by raising legs and lowering head at the same time. Use arms to assist moving without twisting. Bend both knees to roll onto back if desired. To sit up, start from lying on side, and use same move-ments in reverse. Keep trunk aligned with legs.  Log Roll    Lying on back, bend left knee and place left arm across chest. Roll all in one movement to the right. Reverse to roll to the left. Always move as one unit.   Avoid  Twisting    Avoid twisting or bending back. Pivot around using foot movements, and bend at knees if needed when reaching for articles.   Copyright  VHI. All rights reserved.

## 2016-12-05 ENCOUNTER — Telehealth: Payer: Self-pay

## 2016-12-05 DIAGNOSIS — M5431 Sciatica, right side: Secondary | ICD-10-CM

## 2016-12-05 NOTE — Telephone Encounter (Signed)
Pt called stating that she is no better.  This will be her 4th day of prednisone. Please advise. -EH/RMA

## 2016-12-05 NOTE — Telephone Encounter (Signed)
Okay, recommend go ahead and get an x-ray done. Order placed. She can go her convenience on Monday and let's see if we can get her in with one of our sports medicine providers next week.

## 2016-12-08 ENCOUNTER — Ambulatory Visit (INDEPENDENT_AMBULATORY_CARE_PROVIDER_SITE_OTHER): Payer: 59

## 2016-12-08 DIAGNOSIS — I700 Atherosclerosis of aorta: Secondary | ICD-10-CM

## 2016-12-08 DIAGNOSIS — M5431 Sciatica, right side: Secondary | ICD-10-CM

## 2016-12-08 NOTE — Telephone Encounter (Signed)
Patient notified via v/m.

## 2016-12-10 ENCOUNTER — Telehealth: Payer: Self-pay | Admitting: Family Medicine

## 2016-12-10 NOTE — Telephone Encounter (Signed)
Patient advised and scheduled.  

## 2016-12-10 NOTE — Telephone Encounter (Signed)
Can you review patient's xray that metheney ordered on 12/08/2016

## 2016-12-10 NOTE — Telephone Encounter (Signed)
Patient called adv she had an x-ray done on 12/08/16 and hasn't heard anything about the results. Patient has missed work and would like to know if she needs to make an appointment with Dr. Karie Schwalbe and if she can possibly be worked in this week because she has already missed a lot of work. Please call pt and adv. Thanks

## 2016-12-10 NOTE — Telephone Encounter (Signed)
X-ray shows some arthritis and possible intravertebral disc disease but otherwise normal alignment and no fracture. Please have her schedule a visit with Dr. Karie Schwalbe if any additional questions

## 2016-12-11 ENCOUNTER — Encounter: Payer: 59 | Admitting: Physical Therapy

## 2016-12-11 ENCOUNTER — Ambulatory Visit (INDEPENDENT_AMBULATORY_CARE_PROVIDER_SITE_OTHER): Payer: 59 | Admitting: Sports Medicine

## 2016-12-11 ENCOUNTER — Encounter: Payer: Self-pay | Admitting: Sports Medicine

## 2016-12-11 DIAGNOSIS — M5416 Radiculopathy, lumbar region: Secondary | ICD-10-CM

## 2016-12-11 MED ORDER — CYCLOBENZAPRINE HCL 10 MG PO TABS: ORAL_TABLET | ORAL | 0 refills | Status: DC

## 2016-12-11 MED ORDER — DIAZEPAM 5 MG PO TABS: ORAL_TABLET | ORAL | 0 refills | Status: DC

## 2016-12-11 NOTE — Assessment & Plan Note (Signed)
Failed greater than 6 weeks of physician directed conservative measures including formal physical therapy. At this point we are going to proceed with an MRI for interventional planning, distribution is difficult to tell, but likely right L4 or L5. She can double her tramadol to 2 pills 3 times per day, adding Flexeril, and Valium for an MRI. Return to go over MRI results.

## 2016-12-11 NOTE — Progress Notes (Signed)
   Subjective:    I'm seeing this patient as a consultation for:  Dr. Nani Gasseratherine Metheney  CC: Low back pain  HPI: Last month this 56 year old female was treated for low back pain with a steroid injection, physical therapy, tramadol. Unfortunately she continued to have pain, moderate, persistent, localized in the right low back with radiation down the right leg. X-rays showed widespread degenerative changes, at this point she still greater than 6 weeks of physician directed rehabilitative measures and conservative treatment, has persistent pain, worse with sitting, flexion, Valsalva, no bowel or bladder dysfunction, saddle numbness, constitutional symptoms.  Past medical history, Surgical history, Family history not pertinant except as noted below, Social history, Allergies, and medications have been entered into the medical record, reviewed, and no changes needed.   Review of Systems: No headache, visual changes, nausea, vomiting, diarrhea, constipation, dizziness, abdominal pain, skin rash, fevers, chills, night sweats, weight loss, swollen lymph nodes, body aches, joint swelling, muscle aches, chest pain, shortness of breath, mood changes, visual or auditory hallucinations.   Objective:   General: Well Developed, well nourished, and in no acute distress.  Neuro:  Extra-ocular muscles intact, able to move all 4 extremities, sensation grossly intact.  Deep tendon reflexes tested were normal. Psych: Alert and oriented, mood congruent with affect. ENT:  Ears and nose appear unremarkable.  Hearing grossly normal. Neck: Unremarkable overall appearance, trachea midline.  No visible thyroid enlargement. Eyes: Conjunctivae and lids appear unremarkable.  Pupils equal and round. Skin: Warm and dry, no rashes noted.  Cardiovascular: Pulses palpable, no extremity edema. Back Exam:  Inspection: Unremarkable  Motion: Flexion 45 deg, Extension 45 deg, Side Bending to 45 deg bilaterally,  Rotation to 45  deg bilaterally  SLR laying: Negative  XSLR laying: Negative  Palpable tenderness: None. FABER: negative. Sensory change: Gross sensation intact to all lumbar and sacral dermatomes.  Reflexes: 2+ at both patellar tendons, 2+ at achilles tendons, Babinski's downgoing.  Strength at foot  Plantar-flexion: 5/5 Dorsi-flexion: 5/5 Eversion: 5/5 Inversion: 5/5  Leg strength  Quad: 5/5 Hamstring: 5/5 Hip flexor: 5/5 Hip abductors: 5/5  Gait unremarkable.  X-rays reviewed and show widespread lumbar degenerative changes  Impression and Recommendations:   This case required medical decision making of moderate complexity.  Right lumbar radiculopathy Failed greater than 6 weeks of physician directed conservative measures including formal physical therapy. At this point we are going to proceed with an MRI for interventional planning, distribution is difficult to tell, but likely right L4 or L5. She can double her tramadol to 2 pills 3 times per day, adding Flexeril, and Valium for an MRI. Return to go over MRI results.

## 2016-12-12 ENCOUNTER — Telehealth: Payer: Self-pay | Admitting: *Deleted

## 2016-12-12 MED ORDER — TRAMADOL HCL 50 MG PO TABS: 50.0000 mg | ORAL_TABLET | Freq: Three times a day (TID) | ORAL | 0 refills | Status: DC | PRN

## 2016-12-12 NOTE — Telephone Encounter (Signed)
Patient notified

## 2016-12-12 NOTE — Telephone Encounter (Signed)
Rx in box. 

## 2016-12-12 NOTE — Telephone Encounter (Signed)
Patient called and states she had less Tramadol on hand than what she thought and she will need another prescription. Please advise or write a prescription if appropriate.

## 2016-12-17 ENCOUNTER — Ambulatory Visit (INDEPENDENT_AMBULATORY_CARE_PROVIDER_SITE_OTHER): Payer: 59 | Admitting: Physical Therapy

## 2016-12-17 DIAGNOSIS — M5441 Lumbago with sciatica, right side: Secondary | ICD-10-CM

## 2016-12-18 ENCOUNTER — Encounter: Payer: Self-pay | Admitting: Physical Therapy

## 2016-12-18 NOTE — Patient Instructions (Signed)
[facility] TA contraction with marching and cl[place] with blue band around knees.

## 2016-12-18 NOTE — Therapy (Signed)
Breanna Gomez, [place], [address] Phone: [phone]   Fax:  [phone]  Physical Therapy Treatment  Patient Details  Name: Breanna Gomez MRN: [id number] Date of Birth: Sep 21, 1960 Referring Provider: Dr Madilyn Fireman  Encounter Date: 12/17/2016      PT End of Session - 12/17/16 1220    Visit Number 2   Number of Visits 12   Date for PT Re-Evaluation 01/14/17   PT Start Time 1526   PT Stop Time 1636   PT Time Calculation (min) 70 min   Activity Tolerance Patient tolerated treatment well      Past Medical History:  Diagnosis Date  . Goiter, euthyroid     Past Surgical History:  Procedure Laterality Date  . BREAST SURGERY     cyst removes from r breast  . removed cyst from tailbone     removed cyst   . TONSILLECTOMY  56 yrs old    There were no vitals filed for this visit.      Subjective Assessment - 12/18/16 1214    Subjective Pt reports she started to feel better yesterday however she is now having a lot of itching where the pain was. Rt lateral thigh.  She has also started using muscle relaxers at night to help her sleep and is taking prednisone   Patient Stated Goals reduced pain   Currently in Pain? Yes   Pain Score 4    Pain Location Back   Pain Orientation Right   Pain Descriptors / Indicators Aching  itchy   Pain Type Acute pain   Pain Onset 1 to 4 weeks ago   Pain Frequency Constant   Aggravating Factors  not sure   Pain Relieving Factors exercises                         [facility] Adult PT Treatment/Exercise - 12/18/16 0001      Lumbar Exercises: Stretches   Passive Hamstring Stretch 2 reps;30 seconds   Quad Stretch 2 reps;30 seconds  prone   Quad Stretch Limitations low lunge hip flexor stretch 2x30sec   ITB Stretch 2 reps;30 seconds     Lumbar Exercises: Aerobic   Stationary Bike nustep L5x5'     Lumbar Exercises: Supine   Clam 20 reps;1 second   Bent Knee Raise 10 reps;5 seconds   Bridge --  3x10 with blue band around knees     Lumbar Exercises: Prone   Straight Leg Raise 20 reps   Opposite Arm/Leg Raise Left arm/Right leg;Right arm/Left leg;10 reps     Modalities   Modalities Traction;Cryotherapy     Cryotherapy   Number Minutes Cryotherapy 15 Minutes   Cryotherapy Location Lumbar Spine   Type of Cryotherapy Ice pack  during traction     Traction   Type of Traction Lumbar  prone   Min (lbs) 30   Max (lbs) 35   Hold Time 60   Rest Time 20   Time 15                PT Education - 12/18/16 1219    Education provided Yes   Education Details HEP progression   Person(s) Educated Patient   Methods Explanation;Demonstration;Handout   Comprehension Returned demonstration;Verbalized understanding             PT Long Term Goals - 12/18/16 1222      PT LONG TERM GOAL #1   Title  I with advanced HEP    Status On-going     PT LONG TERM GOAL #2   Title tolerate sitting to allow her to return to work    Status Partially Met  she has returned to work however not tolerating prolonge sitting.      PT LONG TERM GOAL #3   Title improve FOTO =/< 53% limited    Status On-going     PT LONG TERM GOAL #4   Title to be established after further assessment    Status On-going               Plan - 12/18/16 1220    Clinical Impression Statement This was Breanna Gomez's second visit, no goals met.  She has had a decrease in overall pain however still having symptoms into the Rt lateral thigh.  She tolerated core stabilization exercise well and was able to maintain good form.  Perform lumbar traction for the second time to see if this will help bring symptoms out of leg.  Treatment was provided on 12/17/16 however documentation completed 12/18/16 as the system was down.    Rehab Potential Good   PT Frequency 2x / week   PT Duration 6 weeks   PT Treatment/Interventions Moist Heat;Traction;Ultrasound;Therapeutic  exercise;Dry needling;Manual techniques;Taping;Neuromuscular re-education;Cryotherapy;Electrical Stimulation;Iontophoresis 16m/ml Dexamethasone;Patient/family education   PT Next Visit Plan assess response to second lumbar traction - if no change then stop and try stim/ice for pain, cont with core stabilization   Consulted and Agree with Plan of Care Patient      Patient will benefit from skilled therapeutic intervention in order to improve the following deficits and impairments:  Decreased range of motion, Difficulty walking, Pain, Decreased activity tolerance, Postural dysfunction, Decreased mobility  Visit Diagnosis: Acute right-sided low back pain with right-sided sciatica     Problem List Patient Active Problem List   Diagnosis Date Noted  . Right lumbar radiculopathy 12/11/2016  . Acute pain of left knee 11/24/2016  . HYPERCALCEMIA 05/10/2010  . DISORDER, TOBACCO USE 01/25/2007  . ANXIETY DISORDER, GENERALIZED 11/19/2006  . MICROSCOPIC HEMATURIA 08/28/2006  . MAMMOGRAM, ABNORMAL 08/28/2006  . GOITER NOS 07/29/2006  . HYPERTENSION, BENIGN ESSENTIAL 07/29/2006  . POSTMENOPAUSAL STATUS 07/29/2006  . INSULIN RESISTANCE SYNDROME 06/29/2006  . HYPERLIPIDEMIA 06/29/2006    SJeral PinchPT  12/18/2016, 12:24 PM  [facility] [place] [address]Phone: [phone]  Fax:  [phone] Name: Breanna BELCOURTMRN: [id number] of Birth: 917-Feb-1962

## 2016-12-19 ENCOUNTER — Ambulatory Visit (INDEPENDENT_AMBULATORY_CARE_PROVIDER_SITE_OTHER): Payer: 59 | Admitting: Physical Therapy

## 2016-12-19 ENCOUNTER — Encounter: Payer: Self-pay | Admitting: Physical Therapy

## 2016-12-19 DIAGNOSIS — M5441 Lumbago with sciatica, right side: Secondary | ICD-10-CM

## 2016-12-19 NOTE — Therapy (Signed)
[place], [place], [address] Phone: [phone]   Fax:  [phone]  Physical Therapy Treatment  Patient Details  Name: Breanna Gomez MRN: [id number] Date of Birth: 1960-12-29 Referring Provider: Dr Madilyn Fireman  Encounter Date: 12/19/2016      PT End of Session - 12/19/16 1144    Visit Number 3   Number of Visits 12   Date for PT Re-Evaluation 01/14/17   PT Start Time 1143   PT Stop Time 1242   PT Time Calculation (min) 59 min   Activity Tolerance Patient tolerated treatment well      Past Medical History:  Diagnosis Date  . Goiter, euthyroid     Past Surgical History:  Procedure Laterality Date  . BREAST SURGERY     cyst removes from r breast  . removed cyst from tailbone     removed cyst   . TONSILLECTOMY  56 yrs old    There were no vitals filed for this visit.      Subjective Assessment - 12/19/16 1144    Subjective Breanna Gomez reports she had her pain return as soon as she got in her car after therapy. Then had some more pain at night.  Settled back down some now. Still having the itching   Patient Stated Goals reduced pain   Currently in Pain? Yes   Pain Score 4    Pain Location Back   Pain Orientation Right   Pain Descriptors / Indicators Aching   Pain Type Acute pain   Pain Radiating Towards Rt lateral thigh   Pain Onset 1 to 4 weeks ago   Pain Frequency Constant   Aggravating Factors  sitting in her car   Pain Relieving Factors not sure                         [facility] Adult PT Treatment/Exercise - 12/19/16 0001      Lumbar Exercises: Aerobic   Stationary Bike nustep L5x5'     Lumbar Exercises: Supine   Straight Leg Raise 15 reps  with TA engagement each side   Isometric Hip Flexion 5 reps;5 seconds   Other Supine Lumbar Exercises pilates table top with small LE movements   Other Supine Lumbar Exercises 5x5sec leg lengtheners     Lumbar Exercises:  Quadruped   Single Arm Raise 10 reps;5 seconds  each side, VC for TA activation   Plank TA activiation, slight knee lift 10 x 5sec     Modalities   Modalities Electrical Stimulation;Cryotherapy;Ultrasound     Cryotherapy   Number Minutes Cryotherapy 15 Minutes   Cryotherapy Location Lumbar Spine   Type of Cryotherapy Ice pack     Electrical Stimulation   Electrical Stimulation Location lumbar and Rt buttocks   Electrical Stimulation Action IFC   Electrical Stimulation Parameters to tolerance   Electrical Stimulation Goals Pain     Ultrasound   Ultrasound Location Rt piriformis and gluts   Ultrasound Parameters combo usstim, 100%   Ultrasound Goals Pain     Manual Therapy   Manual Therapy Soft tissue mobilization;Joint mobilization   Joint Mobilization lumbar CPA and UPA mobs., tender at L4 CPA & Rt UPA   Soft tissue mobilization STM to Rt gluts/piriformis and lumbar paraspinals, TPR to Rt piriformis.                 PT Education - 12/18/16 1219    Education provided Yes  Education Details HEP progression   Person(s) Educated Patient   Methods Explanation;Demonstration;Handout   Comprehension Returned demonstration;Verbalized understanding             PT Long Term Goals - 12/18/16 1222      PT LONG TERM GOAL #1   Title I with advanced HEP    Status On-going     PT LONG TERM GOAL #2   Title tolerate sitting to allow her to return to work    Status Partially Met  she has returned to work however not tolerating prolonge sitting.      PT LONG TERM GOAL #3   Title improve FOTO =/< 53% limited    Status On-going     PT LONG TERM GOAL #4   Title to be established after further assessment    Status On-going               Plan - 12/19/16 1230    Clinical Impression Statement Breanna Gomez has not responded well to traction, tried combo and stim today along with some manual work.  She has tightness in the Rt gluts and piriformis and is hypomobile with  pain in L3-4.  No goal met, will see how she responds to these different modalities.    Rehab Potential Good   PT Frequency 2x / week   PT Duration 6 weeks   PT Treatment/Interventions Moist Heat;Traction;Ultrasound;Therapeutic exercise;Dry needling;Manual techniques;Taping;Neuromuscular re-education;Cryotherapy;Electrical Stimulation;Iontophoresis 23m/ml Dexamethasone;Patient/family education   PT Next Visit Plan assess response to combo/ US/stim and manual work. progress core ex as able.    Consulted and Agree with Plan of Care Patient      Patient will benefit from skilled therapeutic intervention in order to improve the following deficits and impairments:  Decreased range of motion, Difficulty walking, Pain, Decreased activity tolerance, Postural dysfunction, Decreased mobility  Visit Diagnosis: Acute right-sided low back pain with right-sided sciatica     Problem List Patient Active Problem List   Diagnosis Date Noted  . Right lumbar radiculopathy 12/11/2016  . Acute pain of left knee 11/24/2016  . HYPERCALCEMIA 05/10/2010  . DISORDER, TOBACCO USE 01/25/2007  . ANXIETY DISORDER, GENERALIZED 11/19/2006  . MICROSCOPIC HEMATURIA 08/28/2006  . MAMMOGRAM, ABNORMAL 08/28/2006  . GOITER NOS 07/29/2006  . HYPERTENSION, BENIGN ESSENTIAL 07/29/2006  . POSTMENOPAUSAL STATUS 07/29/2006  . INSULIN RESISTANCE SYNDROME 06/29/2006  . HYPERLIPIDEMIA 06/29/2006    SJeral PinchPT  12/19/2016, 12:35 PM  [facility] [address]Phone: [phone]  Fax:  [phone] Name: Breanna SCHWARZMRN: [id number] of Birth: 9July 28, 1962

## 2016-12-22 ENCOUNTER — Ambulatory Visit (INDEPENDENT_AMBULATORY_CARE_PROVIDER_SITE_OTHER): Payer: 59

## 2016-12-22 DIAGNOSIS — M5126 Other intervertebral disc displacement, lumbar region: Secondary | ICD-10-CM

## 2016-12-22 DIAGNOSIS — M5416 Radiculopathy, lumbar region: Secondary | ICD-10-CM

## 2016-12-25 ENCOUNTER — Ambulatory Visit (INDEPENDENT_AMBULATORY_CARE_PROVIDER_SITE_OTHER): Payer: 59 | Admitting: Sports Medicine

## 2016-12-25 ENCOUNTER — Encounter: Payer: Self-pay | Admitting: Sports Medicine

## 2016-12-25 DIAGNOSIS — M5416 Radiculopathy, lumbar region: Secondary | ICD-10-CM

## 2016-12-25 MED ORDER — CYCLOBENZAPRINE HCL 10 MG PO TABS: 10.0000 mg | ORAL_TABLET | Freq: Every day | ORAL | 3 refills | Status: DC

## 2016-12-25 MED ORDER — TRAMADOL HCL 50 MG PO TABS: 50.0000 mg | ORAL_TABLET | Freq: Two times a day (BID) | ORAL | 0 refills | Status: DC

## 2016-12-25 NOTE — Progress Notes (Signed)
  Subjective:    CC: MRI follow-up  HPI: Latorie returns, she had right lumbar radiculopathy, she failed conservative measures so we obtained an MRI, the results of which will be dictated below. Interestingly her pain has now resolved.  Past medical history:  Negative.  See flowsheet/record as well for more information.  Surgical history: Negative.  See flowsheet/record as well for more information.  Family history: Negative.  See flowsheet/record as well for more information.  Social history: Negative.  See flowsheet/record as well for more information.  Allergies, and medications have been entered into the medical record, reviewed, and no changes needed.   Review of Systems: No fevers, chills, night sweats, weight loss, chest pain, or shortness of breath.   Objective:    General: Well Developed, well nourished, and in no acute distress.  Neuro: Alert and oriented x3, extra-ocular muscles intact, sensation grossly intact.  HEENT: Normocephalic, atraumatic, pupils equal round reactive to light, neck supple, no masses, no lymphadenopathy, thyroid nonpalpable.  Skin: Warm and dry, no rashes. Cardiac: Regular rate and rhythm, no murmurs rubs or gallops, no lower extremity edema.  Respiratory: Clear to auscultation bilaterally. Not using accessory muscles, speaking in full sentences.  MRI shows a right L2-L3 disc protrusion clearly contact in the right nerve root  Impression and Recommendations:    Right lumbar radiculopathy Right L2/3 protruding disc with clear compression of the right L2 nerve root. Symptoms are OK today, we will continue to monitor. At this point no injections, return as needed. She uses about two tramadol per day so I am happy to refill this for now.

## 2016-12-25 NOTE — Assessment & Plan Note (Signed)
Right L2/3 protruding disc with clear compression of the right L2 nerve root. Symptoms are OK today, we will continue to monitor. At this point no injections, return as needed. She uses about two tramadol per day so I am happy to refill this for now.

## 2016-12-26 ENCOUNTER — Ambulatory Visit (INDEPENDENT_AMBULATORY_CARE_PROVIDER_SITE_OTHER): Payer: 59 | Admitting: Physical Therapy

## 2016-12-26 DIAGNOSIS — M5441 Lumbago with sciatica, right side: Secondary | ICD-10-CM

## 2016-12-26 NOTE — Therapy (Addendum)
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       Past Medical History:  Diagnosis Date  . Goiter, euthyroid     Past Surgical History:  Procedure Laterality Date  . BREAST SURGERY     cyst removes from r breast  . removed cyst from tailbone     removed cyst   . TONSILLECTOMY  56 yrs old    There were no vitals filed for this visit.      Subjective Assessment - 12/26/16 1052    Subjective Breanna Gomez had an MRI on Monday, has a disc out and stenosis.  MD wishes for her to continue with therapy, next step would be injections.    Patient Stated Goals reduced pain   Currently in Pain? Yes   Pain Score 2    Pain Location Back   Pain Orientation Right   Pain Descriptors / Indicators Aching;Burning   Pain Type Acute pain   Pain Radiating Towards Rt lateral thigh   Pain Onset More than a month ago   Pain Frequency Intermittent   Aggravating Factors  not sure now   Pain Relieving Factors taking it easy seems to help                         [facility] Adult PT Treatment/Exercise - 12/26/16 0001      Self-Care   ADL's practiced hip hinges, reviewed and practiced safe body mechancs      Lumbar Exercises: Supine   Other Supine Lumbar Exercises use of abdominal stabilzer for biofeedback to activiate TA     Lumbar Exercises: Prone   Other Prone Lumbar Exercises 10 press ups   Other Prone Lumbar Exercises  10 pelvic presses     Modalities   Modalities Electrical Stimulation;Cryotherapy;Ultrasound     Cryotherapy   Number Minutes Cryotherapy 15 Minutes   Cryotherapy Location Lumbar Spine   Type of Cryotherapy Ice pack     Electrical Stimulation   Electrical Stimulation Location lumbar and Rt buttocks   Electrical Stimulation Action IFC to tolerance   Electrical Stimulation Goals Pain     Ultrasound   Ultrasound Location Rt piriformis & gluts   Ultrasound Parameters combo stim/us 100% 1.1mz, 1.5 w/cm2   Ultrasound Goals Pain                     PT Long Term Goals - 12/26/16 1153      PT LONG TERM GOAL #1   Title I with advanced HEP    Status On-going     PT LONG TERM GOAL #2   Title tolerate sitting to allow her to return to work    Status Partially Met     PT LONG TERM GOAL #3   Title improve FOTO =/< 53% limited    Status On-going  PT LONG TERM GOAL #4   Title demo strong contraction of TA with core work and stabilizing pelvis    Time 4   Period Weeks   Target Date 01/14/17               Plan - 12/26/16 1150    Clinical Impression Statement [name] had a good response to combo and the last visit.  She verbalized a good understanding of safe body mechanics after instruction.  She is having a lot of difficulty activating her transverse abdominus.  slow progress to goals. Confirmation that she does have a bulging disc so will continue using extension exercise.    Rehab Potential Good   PT Frequency 2x / week   PT Duration 6 weeks   PT Treatment/Interventions Moist Heat;Traction;Ultrasound;Therapeutic exercise;Dry needling;Manual techniques;Taping;Neuromuscular re-education;Cryotherapy;Electrical Stimulation;Iontophoresis 50m/ml Dexamethasone;Patient/family education   PT Next Visit Plan teach pelvic press series      Patient will benefit from skilled therapeutic intervention in order to improve the following deficits and impairments:  Decreased  range of motion, Difficulty walking, Pain, Decreased activity tolerance, Postural dysfunction, Decreased mobility  Visit Diagnosis: Acute right-sided low back pain with right-sided sciatica     Problem List Patient Active Problem List   Diagnosis Date Noted  . Right lumbar radiculopathy 12/11/2016  . Acute pain of left knee 11/24/2016  . HYPERCALCEMIA 05/10/2010  . DISORDER, TOBACCO USE 01/25/2007  . ANXIETY DISORDER, GENERALIZED 11/19/2006  . MICROSCOPIC HEMATURIA 08/28/2006  . MAMMOGRAM, ABNORMAL 08/28/2006  . GOITER NOS 07/29/2006  . HYPERTENSION, BENIGN ESSENTIAL 07/29/2006  . POSTMENOPAUSAL STATUS 07/29/2006  . INSULIN RESISTANCE SYNDROME 06/29/2006  . HYPERLIPIDEMIA 06/29/2006    SJeral PinchPT 12/26/2016, 11:56 AM  [facility] [address]Phone: [phone]  Fax:  [phone] Name: Breanna DIMASCIOMRN: [id number] of Birth: [birth year]  PHYSICAL THERAPY DISCHARGE SUMMARY  Visits from Start of Care: 4  Current functional level related to goals / functional outcomes: unknown   Remaining deficits: unknown   Education / Equipment: HEP  Plan:                                                    Patient goals were partially met. Patient is being discharged due to not returning since the last visit.  ?????    SJeral Pinch PT 01/26/17 8:54 AM

## 2016-12-26 NOTE — Patient Instructions (Addendum)
Bending    Bend at hips and knees, not back. Keep feet shoulder-width apart.  Avoid Twisting    Avoid twisting or bending back. Pivot around using foot movements, and bend at knees if needed when reaching for articles.    Cart    When reaching into cart with one arm, lift opposite leg to keep back straight.   Carrying Luggage    Distribute weight evenly on both sides. Use a cart whenever possible. Do not twist trunk. Move body as a unit.   Brushing Teeth     Place one foot on ledge and one hand on counter. Bend other knee slightly to keep back straight. .  Gardening - Mowing    Keep arms close to sides and walk with lawn mower.   Housework - Vacuuming    Hold the vacuum with arm held at side. Step back and forth to move it, keeping head up. Avoid twisting.   Childcare - Picking Up from Floor    Squat down to pick up baby, and bring close before standing up. Use knees and keep back straight.   Childcare - Carrying    Keep baby close and as upright as possible.  Childcare - In / Out of Tub    Squat or kneel down close to edge of tub to lower child into tub or to lift out. Be sure there is a safety mat inside.   Copyright  VHI. All rights reserved.   TENS UNIT: This is helpful for muscle pain and spasm.   Search and Purchase a TENS 7000 2nd edition at www.tenspros.com. It should be less than $30.     TENS unit instructions: Do not shower or bathe with the unit on Turn the unit off before removing electrodes or batteries If the electrodes lose stickiness add a drop of water to the electrodes after they are disconnected from the unit and place on plastic sheet. If you continued to have difficulty, call the TENS unit company to purchase more electrodes. Do not apply lotion on the skin area prior to use. Make sure the skin is clean and dry as this will help prolong the life of the electrodes. After use, always check skin for unusual red areas,  rash or other skin difficulties. If there are any skin problems, does not apply electrodes to the same area. Never remove the electrodes from the unit by pulling the wires. Do not use the TENS unit or electrodes other than as directed. Do not change electrode placement without consultating your therapist or physician. Keep 2 fingers with between each electrode. Wear time ratio is 2:1, on to off times.    For example on for 30 minutes off for 15 minutes and then on for 30 minutes off for 15 minutes

## 2017-01-06 ENCOUNTER — Ambulatory Visit: Payer: 59 | Admitting: Family Medicine

## 2017-01-20 ENCOUNTER — Encounter: Payer: 59 | Admitting: Family Medicine

## 2017-02-04 ENCOUNTER — Ambulatory Visit (INDEPENDENT_AMBULATORY_CARE_PROVIDER_SITE_OTHER): Payer: 59 | Admitting: Family Medicine

## 2017-02-04 ENCOUNTER — Other Ambulatory Visit (HOSPITAL_COMMUNITY)
Admission: RE | Admit: 2017-02-04 | Discharge: 2017-02-04 | Disposition: A | Discharge status: Home / Self Care | Payer: 59 | Source: Ambulatory Visit | Attending: Family Medicine | Admitting: Family Medicine

## 2017-02-04 VITALS — BP 123/69 | HR 78 | Ht 60.0 in | Wt 154.0 lb

## 2017-02-04 DIAGNOSIS — E348 Other specified endocrine disorders: Secondary | ICD-10-CM | POA: Diagnosis not present

## 2017-02-04 DIAGNOSIS — Z0000 Encounter for general adult medical examination without abnormal findings: Secondary | ICD-10-CM | POA: Diagnosis not present

## 2017-02-04 DIAGNOSIS — E785 Hyperlipidemia, unspecified: Secondary | ICD-10-CM

## 2017-02-04 DIAGNOSIS — Z124 Encounter for screening for malignant neoplasm of cervix: Secondary | ICD-10-CM | POA: Insufficient documentation

## 2017-02-04 DIAGNOSIS — I10 Essential (primary) hypertension: Secondary | ICD-10-CM

## 2017-02-04 DIAGNOSIS — K2100 Gastro-esophageal reflux disease with esophagitis, without bleeding: Secondary | ICD-10-CM

## 2017-02-04 DIAGNOSIS — Z23 Encounter for immunization: Secondary | ICD-10-CM | POA: Diagnosis not present

## 2017-02-04 LAB — COMPLETE METABOLIC PANEL WITH GFR
AG Ratio: 1.8 (calc) (ref 1.0–2.5)
ALBUMIN MSPROF: 4.3 g/dL (ref 3.6–5.1)
ALT: 17 U/L (ref 6–29)
AST: 16 U/L (ref 10–35)
Alkaline phosphatase (APISO): 66 U/L (ref 33–130)
BUN: 17 mg/dL (ref 7–25)
CALCIUM: 10 mg/dL (ref 8.6–10.4)
CO2: 28 mmol/L (ref 20–32)
CREATININE: 0.76 mg/dL (ref 0.50–1.05)
Chloride: 101 mmol/L (ref 98–110)
GFR, EST AFRICAN AMERICAN: 100 mL/min/{1.73_m2} (ref >=60)
GFR, EST NON AFRICAN AMERICAN: 86 mL/min/{1.73_m2} (ref >=60)
GLOBULIN: 2.4 g/dL (ref 1.9–3.7)
Glucose, Bld: 108 mg/dL — ABNORMAL HIGH (ref 65–99)
Potassium: 4.8 mmol/L (ref 3.5–5.3)
SODIUM: 137 mmol/L (ref 135–146)
Total Bilirubin: 0.6 mg/dL (ref 0.2–1.2)
Total Protein: 6.7 g/dL (ref 6.1–8.1)

## 2017-02-04 LAB — LIPID PANEL W/REFLEX DIRECT LDL
CHOLESTEROL: 201 mg/dL — AB (ref <200)
HDL: 52 mg/dL (ref >50)
LDL Cholesterol (Calc): 121 mg/dL (calc) — ABNORMAL HIGH
Non-HDL Cholesterol (Calc): 149 mg/dL (calc) — ABNORMAL HIGH (ref <130)
Total CHOL/HDL Ratio: 3.9 (calc) (ref <5.0)
Triglycerides: 165 mg/dL — ABNORMAL HIGH (ref <150)

## 2017-02-04 LAB — TSH: TSH: 1.74 m[IU]/L (ref 0.40–4.50)

## 2017-02-04 LAB — POCT GLYCOSYLATED HEMOGLOBIN (HGB A1C): Hemoglobin A1C: 5.8

## 2017-02-04 NOTE — Progress Notes (Signed)
Subjective:     Breanna Gomez is a 56 y.o. female and is here for a comprehensive physical exam. The patient reports problems - reflux has been worse over last couple of weeks. She said it was severe enough and radiating into her back and at that she actually had to leave work one day early. She does take a PPI daily. But she takes it with her protein drink in the morning instead of before hand. She says the discomfort has been better but she is now just belching a lot.  Social History   Social History  . Marital status: Married    Spouse name: N/A  . Number of children: 2  . Years of education: N/A   Occupational History  .  [employer]   Social History Main Topics  . Smoking status: Current Every Day Smoker    Packs/day: 0.50    Years: 40.00    Types: Cigarettes  . Smokeless tobacco: Never Used  . Alcohol use No  . Drug use: No  . Sexual activity: Yes    Partners: Male   Other Topics Concern  . Not on file   Social History Narrative   No regular exercise.  Daily caffeine intake.    Health Maintenance  Topic Date Due  . PAP SMEAR  09/23/2015  . MAMMOGRAM  12/06/2017  . TETANUS/TDAP  09/15/2018  . COLONOSCOPY  11/23/2018  . INFLUENZA VACCINE  Completed  . Hepatitis C Screening  Completed  . HIV Screening  Completed    The following portions of the patient's history were reviewed and updated as appropriate: allergies, current medications, past family history, past medical history, past social history, past surgical history and problem list.  Review of Systems A comprehensive review of systems was negative.   Objective:    BP 123/69   Pulse 78   Ht 5' (1.524 m)   Wt 154 lb (69.9 kg)   LMP 05/12/2000   SpO2 100%   BMI 30.08 kg/m  General appearance: alert, cooperative and appears stated age Head: Normocephalic, without obvious abnormality, atraumatic Eyes: conj clear, EOMI, PEERLA Ears: normal TM's and external ear canals both ears Nose: Nares normal.  Septum midline. Mucosa normal. No drainage or sinus tenderness. Throat: lips, mucosa, and tongue normal; teeth and gums normal Neck: no adenopathy, no carotid bruit, no JVD, supple, symmetrical, trachea midline and thyroid not enlarged, symmetric, no tenderness/mass/nodules Back: symmetric, no curvature. ROM normal. No CVA tenderness. Lungs: clear to auscultation bilaterally Breasts: normal appearance, no masses or tenderness Heart: regular rate and rhythm, S1, S2 normal, no murmur, click, rub or gallop Abdomen: soft, non-tender; bowel sounds normal; no masses,  no organomegaly Pelvic: cervix normal in appearance, external genitalia normal, no adnexal masses or tenderness, no cervical motion tenderness, rectovaginal septum normal, uterus normal size, shape, and consistency and vagina normal without discharge Extremities: extremities normal, atraumatic, no cyanosis or edema Pulses: 2+ and symmetric Skin: Skin color, texture, turgor normal. No rashes or lesions Lymph nodes: Cervical, supraclavicular, and axillary nodes normal. Neurologic: Alert and oriented X 3, normal strength and tone. Normal symmetric reflexes. Normal coordination and gait    Assessment:    Healthy female exam.      Plan:     See After Visit Summary for Counseling Recommendations   Keep up a regular exercise program and make sure you are eating a healthy diet Try to eat 4 servings of dairy a day, or if you are lactose intolerant take a  calcium with vitamin D daily.  Your vaccines are up to date.   GERD - Discussed moving PPI to 20 min before breakfast.  Could also add a probiotic since she's had a lot of gas and belching. And even consider in the future looking at her gallbladder again. She's had an ultrasounds previously indicating either a stone or polyp. And she says she definitely gets discomfort if she eats greasy foods.  IFG - stable. F/U in 6 months.

## 2017-02-05 ENCOUNTER — Other Ambulatory Visit: Payer: Self-pay | Admitting: Family Medicine

## 2017-02-06 LAB — CYTOLOGY - PAP
Diagnosis: NEGATIVE
HPV: NOT DETECTED

## 2017-02-06 NOTE — Progress Notes (Signed)
Call patient: Your Pap smear is normal. Repeat in 5 years.

## 2017-02-20 LAB — HM MAMMOGRAPHY

## 2017-02-27 ENCOUNTER — Encounter: Payer: Self-pay | Admitting: Family Medicine

## 2017-03-03 ENCOUNTER — Ambulatory Visit (INDEPENDENT_AMBULATORY_CARE_PROVIDER_SITE_OTHER): Payer: 59 | Admitting: Sports Medicine

## 2017-03-03 DIAGNOSIS — M25562 Pain in left knee: Secondary | ICD-10-CM

## 2017-03-03 MED ORDER — CELECOXIB 200 MG PO CAPS: 200.0000 mg | ORAL_CAPSULE | Freq: Every day | ORAL | 2 refills | Status: DC

## 2017-03-03 MED ORDER — GABAPENTIN 300 MG PO CAPS: 300.0000 mg | ORAL_CAPSULE | Freq: Every day | ORAL | 3 refills | Status: DC

## 2017-03-03 NOTE — Assessment & Plan Note (Signed)
Did have some improvement with prednisone, and topical diclofenac. Pain now is further down over the shin rather than at the joint. I do not see any clear contact of the left L4 or L5 nerve roots with a disc or facet joint. Adding Celebrex, and gabapentin at bedtime, return to see me in a month.

## 2017-03-03 NOTE — Progress Notes (Signed)
   Subjective:    I'm seeing this patient as a consultation for: Dr. Nani Gasseratherine Metheney  CC: Left lower leg pain  HPI: For the past several months this 56 year old female has had left knee pain, radiating to the left shin, aching, worse when getting up from a seated position and worse at night.  Nothing overtly radicular with paresthesias to the foot.  She has had difficulty tolerating oral NSAIDs in the past but was able to tolerate oral prednisone without a problem.  Past medical history, Surgical history, Family history not pertinant except as noted below, Social history, Allergies, and medications have been entered into the medical record, reviewed, and no changes needed.   Review of Systems: No headache, visual changes, nausea, vomiting, diarrhea, constipation, dizziness, abdominal pain, skin rash, fevers, chills, night sweats, weight loss, swollen lymph nodes, body aches, joint swelling, muscle aches, chest pain, shortness of breath, mood changes, visual or auditory hallucinations.   Objective:   General: Well Developed, well nourished, and in no acute distress.  Neuro:  Extra-ocular muscles intact, able to move all 4 extremities, sensation grossly intact.  Deep tendon reflexes tested were normal. Psych: Alert and oriented, mood congruent with affect. ENT:  Ears and nose appear unremarkable.  Hearing grossly normal. Neck: Unremarkable overall appearance, trachea midline.  No visible thyroid enlargement. Eyes: Conjunctivae and lids appear unremarkable.  Pupils equal and round. Skin: Warm and dry, no rashes noted.  Cardiovascular: Pulses palpable, no extremity edema. Left knee: Normal to inspection with no erythema or effusion or obvious bony abnormalities. Palpation normal with no warmth or joint line tenderness or patellar tenderness or condyle tenderness. ROM normal in flexion and extension and lower leg rotation. Ligaments with solid consistent endpoints including ACL, PCL, LCL,  MCL. Negative Mcmurray's and provocative meniscal tests. Non painful patellar compression. Patellar and quadriceps tendons unremarkable. Hamstring and quadriceps strength is normal.  Impression and Recommendations:   This case required medical decision making of moderate complexity.  Acute pain of left knee Did have some improvement with prednisone, and topical diclofenac. Pain now is further down over the shin rather than at the joint. I do not see any clear contact of the left L4 or L5 nerve roots with a disc or facet joint. Adding Celebrex, and gabapentin at bedtime, return to see me in a month. ___________________________________________ [name], M.D., ABFM., CAQSM. Primary Care and Sports Medicine [facility]  Adjunct Instructor of Family Medicine  [employer]

## 2017-03-07 ENCOUNTER — Other Ambulatory Visit: Payer: Self-pay | Admitting: Family Medicine

## 2017-03-31 ENCOUNTER — Ambulatory Visit: Payer: 59 | Admitting: Sports Medicine

## 2017-04-16 ENCOUNTER — Ambulatory Visit: Payer: 59 | Admitting: Sports Medicine

## 2017-04-16 ENCOUNTER — Encounter: Payer: Self-pay | Admitting: Sports Medicine

## 2017-04-16 DIAGNOSIS — M79662 Pain in left lower leg: Secondary | ICD-10-CM

## 2017-04-16 MED ORDER — GABAPENTIN 100 MG PO CAPS: ORAL_CAPSULE | ORAL | 11 refills | Status: DC

## 2017-04-16 NOTE — Assessment & Plan Note (Signed)
Patient self discontinued gabapentin, felt drowsy. Decreasing to 100 mg in an up taper. If I really squint my eyes I could possibly convince myself that the left L4-L5 disc is coming close to the left L4 nerve root. Because it is not extremely clear we are not proceeding with intervention, if persistent symptoms after the due diligence of an up taper of gabapentin we will consider nerve conduction/EMG. I really do not think this is coming from her knee.

## 2017-04-16 NOTE — Progress Notes (Signed)
  Subjective:    CC: Left shin pain  HPI: This is a pleasant 56 year old female, she has had pain over her left anterior shin for some time now, more recently it got worse with a long car ride.  She does have some back pain.  Moderate, persistent.  Past medical history:  Negative.  See flowsheet/record as well for more information.  Surgical history: Negative.  See flowsheet/record as well for more information.  Family history: Negative.  See flowsheet/record as well for more information.  Social history: Negative.  See flowsheet/record as well for more information.  Allergies, and medications have been entered into the medical record, reviewed, and no changes needed.   (To billers/coders, pertinent past medical, social, surgical, family history can be found in problem list, if problem list is marked as reviewed then this indicates that past medical, social, surgical, family history was also reviewed)  Review of Systems: No fevers, chills, night sweats, weight loss, chest pain, or shortness of breath.   Objective:    General: Well Developed, well nourished, and in no acute distress.  Neuro: Alert and oriented x3, extra-ocular muscles intact, sensation grossly intact.  HEENT: Normocephalic, atraumatic, pupils equal round reactive to light, neck supple, no masses, no lymphadenopathy, thyroid nonpalpable.  Skin: Warm and dry, no rashes. Cardiac: Regular rate and rhythm, no murmurs rubs or gallops, no lower extremity edema.  Respiratory: Clear to auscultation bilaterally. Not using accessory muscles, speaking in full sentences. Left knee: Normal to inspection with no erythema or effusion or obvious bony abnormalities. Palpation normal with no warmth or joint line tenderness or patellar tenderness or condyle tenderness. ROM normal in flexion and extension and lower leg rotation. Ligaments with solid consistent endpoints including ACL, PCL, LCL, MCL. Negative Mcmurray's and provocative  meniscal tests. Non painful patellar compression. Patellar and quadriceps tendons unremarkable. Hamstring and quadriceps strength is normal.  Impression and Recommendations:    Pain in left shin Patient self discontinued gabapentin, felt drowsy. Decreasing to 100 mg in an up taper. If I really squint my eyes I could possibly convince myself that the left L4-L5 disc is coming close to the left L4 nerve root. Because it is not extremely clear we are not proceeding with intervention, if persistent symptoms after the due diligence of an up taper of gabapentin we will consider nerve conduction/EMG. I really do not think this is coming from her knee.  I spent 25 minutes with this patient, greater than 50% was face-to-face time counseling regarding the above diagnoses ___________________________________________ Breanna Gomez, M.D., ABFM., CAQSM. Primary Care and Sports Medicine [facility]  Adjunct Instructor of Family Medicine  [employer]

## 2017-05-04 ENCOUNTER — Other Ambulatory Visit: Payer: Self-pay | Admitting: Physician Assistant

## 2017-05-04 DIAGNOSIS — M25562 Pain in left knee: Secondary | ICD-10-CM

## 2017-05-19 ENCOUNTER — Ambulatory Visit: Payer: 59 | Admitting: Sports Medicine

## 2017-05-19 ENCOUNTER — Encounter: Payer: Self-pay | Admitting: Sports Medicine

## 2017-05-19 DIAGNOSIS — M79662 Pain in left lower leg: Secondary | ICD-10-CM | POA: Diagnosis not present

## 2017-05-19 DIAGNOSIS — M25562 Pain in left knee: Secondary | ICD-10-CM | POA: Diagnosis not present

## 2017-05-19 MED ORDER — CELECOXIB 200 MG PO CAPS: 200.0000 mg | ORAL_CAPSULE | Freq: Two times a day (BID) | ORAL | 3 refills | Status: DC

## 2017-05-19 MED ORDER — GABAPENTIN 100 MG PO CAPS: ORAL_CAPSULE | ORAL | 3 refills | Status: DC

## 2017-05-19 NOTE — Assessment & Plan Note (Signed)
Good improvement with gabapentin at twice a day. We will increase to 100 mg in the morning and 200 mg at bedtime. She likely has a left L4 radiculitis. Continue Celebrex twice a day. Return as needed. She is happy with how things are going now.

## 2017-05-19 NOTE — Progress Notes (Signed)
Subjective:    CC: Recheck shin pain  HPI: Breanna Gomez is a pleasant 57 year old female, she has had left anterior shin pain for some time now.  At the last visit we suspected a L4 radiculitis, if I really squint it I could possibly see contact of the left L4 nerve root with the L4-L5 disc.  We started low-dose gabapentin and slow up taper which has seemed to take care of her discomfort.  She is happy with how things are going.  Does need a refill on her Celebrex and gabapentin.  I reviewed the past medical history, family history, social history, surgical history, and allergies today and no changes were needed.  Please see the problem list section below in epic for further details.  Past Medical History: Past Medical History:  Diagnosis Date  . Goiter, euthyroid    Past Surgical History: Past Surgical History:  Procedure Laterality Date  . BREAST SURGERY     cyst removes from r breast  . removed cyst from tailbone     removed cyst   . TONSILLECTOMY  57 yrs old   Social History: Social History   Socioeconomic History  . Marital status: Married    Spouse name: None  . Number of children: 2  . Years of education: None  . Highest education level: None  Social Needs  . Financial resource strain: None  . Food insecurity - worry: None  . Food insecurity - inability: None  . Transportation needs - medical: None  . Transportation needs - non-medical: None  Occupational History    Employer: [employer]  Tobacco Use  . Smoking status: Current Every Day Smoker    Packs/day: 0.50    Years: 40.00    Pack years: 20.00    Types: Cigarettes  . Smokeless tobacco: Never Used  Substance and Sexual Activity  . Alcohol use: No    Alcohol/week: 0.0 oz  . Drug use: No  . Sexual activity: Yes    Partners: Male  Other Topics Concern  . None  Social History Narrative   No regular exercise.  Daily caffeine intake.    Family History: Family History  Problem Relation Age of Onset  .  Hypertension Mother   . Hyperlipidemia Mother   . Alzheimer's disease Mother   . Hyperlipidemia Father   . Hypertension Father   . Diabetes Father   . Hyperlipidemia Sister   . Hypertension Sister   . Diabetes Sister    Allergies: Allergies  Allergen Reactions  . Nsaids Other (See Comments)    GI ulcers.    Medications: See med rec.  Review of Systems: No fevers, chills, night sweats, weight loss, chest pain, or shortness of breath.   Objective:    General: Well Developed, well nourished, and in no acute distress.  Neuro: Alert and oriented x3, extra-ocular muscles intact, sensation grossly intact.  HEENT: Normocephalic, atraumatic, pupils equal round reactive to light, neck supple, no masses, no lymphadenopathy, thyroid nonpalpable.  Skin: Warm and dry, no rashes. Cardiac: Regular rate and rhythm, no murmurs rubs or gallops, no lower extremity edema.  Respiratory: Clear to auscultation bilaterally. Not using accessory muscles, speaking in full sentences.  Impression and Recommendations:    Pain in left shin Good improvement with gabapentin at twice a day. We will increase to 100 mg in the morning and 200 mg at bedtime. She likely has a left L4 radiculitis. Continue Celebrex twice a day. Return as needed. She is happy with how things are  going now.  I spent 25 minutes with this patient, greater than 50% was face-to-face time counseling regarding the above diagnoses ___________________________________________ Ihor Austin. Benjamin Stain, M.D., ABFM., CAQSM. Primary Care and Sports Medicine [facility]  Adjunct Instructor of Family Medicine  [employer]

## 2017-06-09 ENCOUNTER — Other Ambulatory Visit: Payer: Self-pay | Admitting: Family Medicine

## 2017-06-26 ENCOUNTER — Other Ambulatory Visit: Payer: Self-pay | Admitting: Family Medicine

## 2017-07-23 ENCOUNTER — Encounter: Payer: Self-pay | Admitting: Physician Assistant

## 2017-07-23 ENCOUNTER — Ambulatory Visit: Payer: 59 | Admitting: Physician Assistant

## 2017-07-23 VITALS — BP 138/76 | HR 89 | Temp 98.1°F | Resp 18 | Ht 65.51 in | Wt 155.0 lb

## 2017-07-23 DIAGNOSIS — R0602 Shortness of breath: Secondary | ICD-10-CM | POA: Diagnosis not present

## 2017-07-23 DIAGNOSIS — Z8709 Personal history of other diseases of the respiratory system: Secondary | ICD-10-CM | POA: Diagnosis not present

## 2017-07-23 DIAGNOSIS — E049 Nontoxic goiter, unspecified: Secondary | ICD-10-CM | POA: Diagnosis not present

## 2017-07-23 DIAGNOSIS — J069 Acute upper respiratory infection, unspecified: Secondary | ICD-10-CM

## 2017-07-23 LAB — POCT INFLUENZA A/B
INFLUENZA A, POC: NEGATIVE
Influenza B, POC: NEGATIVE

## 2017-07-23 MED ORDER — GUAIFENESIN ER 600 MG PO TB12: 1200.0000 mg | ORAL_TABLET | Freq: Two times a day (BID) | ORAL | Status: DC

## 2017-07-23 MED ORDER — PREDNISONE 20 MG PO TABS
40.0000 mg | ORAL_TABLET | Freq: Every day | ORAL | 0 refills | Status: AC
Start: 2017-07-23 — End: 2017-07-28

## 2017-07-23 NOTE — Progress Notes (Signed)
HPI:                                                                Breanna Gomez is a 57 y.o. female who [facility] Primary [facility] Medicine today for cough and congestion  URI   This is a new problem. The current episode started yesterday. The problem has been gradually worsening. There has been no fever. Associated symptoms include congestion, coughing (non-productive), headaches, a sore throat and wheezing. Pertinent negatives include no chest pain ("tightness"). She has tried inhaler use for the symptoms.  she is a current smoker and has a history of bronchitis.       No flowsheet data found.    Past Medical History:  Diagnosis Date  . Goiter, euthyroid    Past Surgical History:  Procedure Laterality Date  . BREAST SURGERY     cyst removes from r breast  . removed cyst from tailbone     removed cyst   . TONSILLECTOMY  57 yrs old   Social History   Tobacco Use  . Smoking status: Current Every Day Smoker    Packs/day: 0.50    Years: 40.00    Pack years: 20.00    Types: Cigarettes  . Smokeless tobacco: Never Used  Substance Use Topics  . Alcohol use: No    Alcohol/week: 0.0 oz   family history includes Alzheimer's disease in her mother; Diabetes in her father and sister; Hyperlipidemia in her father, mother, and sister; Hypertension in her father, mother, and sister.    ROS: negative except as noted in the HPI  Medications: Current Outpatient Medications  Medication Sig Dispense Refill  . aspirin 81 MG tablet Take 81 mg by mouth daily.      . celecoxib (CELEBREX) 200 MG capsule Take 1 capsule (200 mg total) by mouth 2 (two) times daily. 60 capsule 3  . citalopram (CELEXA) 20 MG tablet TAKE 1 TABLET BY MOUTH EVERY DAY 90 tablet 0  . cyclobenzaprine (FLEXERIL) 10 MG tablet Take 1 tablet (10 mg total) by mouth at bedtime. 30 tablet 3  . diclofenac sodium (VOLTAREN) 1 % GEL Apply 4 g topically 4 (four) times daily. For knee  osteoarthritis 400 g 4  . gabapentin (NEURONTIN) 100 MG capsule 1 capsule in the morning and 2 capsules at night 90 capsule 3  . LORazepam (ATIVAN) 0.5 MG tablet TAKE 1 TABLET TWICE DAILY AS NEEDED FOR ANXIETY 60 tablet 1  . losartan-hydrochlorothiazide (HYZAAR) 50-12.5 MG tablet TAKE 1 TABLET EVERY DAY 90 tablet 1  . Melatonin 1 MG TABS Take 1 tablet by mouth at bedtime as needed.    . pantoprazole (PROTONIX) 40 MG tablet TAKE ONE TABLET BY MOUTH EVERY DAY 90 tablet 1  . pravastatin (PRAVACHOL) 80 MG tablet TAKE 1 TABLET BY MOUTH AT BEDTIME 90 tablet 1  . traMADol (ULTRAM) 50 MG tablet Take 1 tablet (50 mg total) by mouth 2 (two) times daily. 60 tablet 0  . VENTOLIN HFA 108 (90 Base) MCG/ACT inhaler inhale TWO PUFFS EVERY 6 HOURS AS NEEDED FOR WHEEZING 18 g 4  . buPROPion (WELLBUTRIN XL) 150 MG 24 hr tablet Take 1 tablet (150 mg total) by mouth every morning. 30 tablet 1   No  current facility-administered medications for this visit.    Allergies  Allergen Reactions  . Nsaids Other (See Comments)    GI ulcers.        Objective:  BP 138/76   Pulse 89   Temp 98.1 F (36.7 C)   Resp 18   Ht 5' 5.51" (1.664 m)   Wt 155 lb (70.3 kg)   LMP 05/12/2000   SpO2 95%   BMI 25.39 kg/m  Gen:  alert, not ill-appearing, no distress, appropriate for age HEENT: head normocephalic without obvious abnormality, conjunctiva and cornea clear, right TM with bubbles and serous effusion, left TM pearly gray and transparent, nasal mucosa edematous, oropharynx with erythema, no edema or exudates, uvula midline, neck supple, no cervical adenopathy, trachea midline; thyroid enlarged and there is increased fullness on the right side, no tenderness or palpable nodule Pulm: Normal work of breathing, normal phonation, clear to auscultation bilaterally, no wheezes, rales or rhonchi, bronchitic cough CV: Normal rate, regular rhythm, s1 and s2 distinct, no murmurs, clicks or rubs  Neuro: alert and oriented x 3, no  tremor MSK: extremities atraumatic, normal gait and station Skin: intact, no rashes on exposed skin, no jaundice, no cyanosis   No results found for this or any previous visit (from the past 72 hour(s)). No results found.    Assessment and Plan: 57 y.o. female with   1. Acute upper respiratory infection - POCT Influenza A/B negative. Symptoms are consistent with a viral URI. Will give steroid burst since she is a smoker and likely has some underlying COPD - predniSONE (DELTASONE) 20 MG tablet; Take 2 tablets (40 mg total) by mouth daily with breakfast for 5 days.  Dispense: 10 tablet; Refill: 0 - guaiFENesin (MUCINEX) 600 MG 12 hr tablet; Take 2 tablets (1,200 mg total) by mouth 2 (two) times daily.   2. Goiter Lab Results  Component Value Date   TSH 1.74 02/04/2017  - patient endorses compressive symptoms, normal thyroid function. States she had a thyroid ultrasound many years ago. No records - US THYROID; Future  3. History of bronchitis   4. Shortness of breath - steroid burst, continue Albuterol 1-2 puffs Q4H prn - predniSONE (DELTASONE) 20 MG tablet; Take 2 tablets (40 mg total) by mouth daily with breakfast for 5 days.  Dispense: 10 tablet; Refill: 0   Patient education and anticipatory guidance given Patient agrees with treatment plan Follow-up as needed if symptoms worsen or fail to improve  Levonne Hubert PA-C

## 2017-07-23 NOTE — Patient Instructions (Addendum)
- mucinex to make cough more productive - flonase for nasal congestion. Avoid Sudafed, which can raise your blood pressure - steroid daily for 5 days to prevent bronchitis - use your Albuterol inhaler 1-2 puffs every 4 hours as needed for shortness of breath and wheezing - tylenol 1000 mg every 8 hours as needed for throat pain and body aches - cepacol throat lozenges    Upper Respiratory Infection, Adult Most upper respiratory infections (URIs) are a viral infection of the air passages leading to the lungs. A URI affects the nose, throat, and upper air passages. The most common type of URI is nasopharyngitis and is typically referred to as "the common cold." URIs run their course and usually go away on their own. Most of the time, a URI does not require medical attention, but sometimes a bacterial infection in the upper airways can follow a viral infection. This is called a secondary infection. Sinus and middle ear infections are common types of secondary upper respiratory infections. Bacterial pneumonia can also complicate a URI. A URI can worsen asthma and chronic obstructive pulmonary disease (COPD). Sometimes, these complications can require emergency medical care and may be life threatening. What are the causes? Almost all URIs are caused by viruses. A virus is a type of germ and can spread from one person to another. What increases the risk? You may be at risk for a URI if:  You smoke.  You have chronic heart or lung disease.  You have a weakened defense (immune) system.  You are very young or very old.  You have nasal allergies or asthma.  You work in crowded or poorly ventilated areas.  You work in health care facilities or schools.  What are the signs or symptoms? Symptoms typically develop 2-3 days after you come in contact with a cold virus. Most viral URIs last 7-10 days. However, viral URIs from the influenza virus (flu virus) can last 14-18 days and are typically more  severe. Symptoms may include:  Runny or stuffy (congested) nose.  Sneezing.  Cough.  Sore throat.  Headache.  Fatigue.  Fever.  Loss of appetite.  Pain in your forehead, behind your eyes, and over your cheekbones (sinus pain).  Muscle aches.  How is this diagnosed? Your health care provider may diagnose a URI by:  Physical exam.  Tests to check that your symptoms are not due to another condition such as: ? Strep throat. ? Sinusitis. ? Pneumonia. ? Asthma.  How is this treated? A URI goes away on its own with time. It cannot be cured with medicines, but medicines may be prescribed or recommended to relieve symptoms. Medicines may help:  Reduce your fever.  Reduce your cough.  Relieve nasal congestion.  Follow these instructions at home:  Take medicines only as directed by your health care provider.  Gargle warm saltwater or take cough drops to comfort your throat as directed by your health care provider.  Use a warm mist humidifier or inhale steam from a shower to increase air moisture. This may make it easier to breathe.  Drink enough fluid to keep your urine clear or pale yellow.  Eat soups and other clear broths and maintain good nutrition.  Rest as needed.  Return to work when your temperature has returned to normal or as your health care provider advises. You may need to stay home longer to avoid infecting others. You can also use a face mask and careful hand washing to prevent spread of the  virus.  Increase the usage of your inhaler if you have asthma.  Do not use any tobacco products, including cigarettes, chewing tobacco, or electronic cigarettes. If you need help quitting, ask your health care provider. How is this prevented? The best way to protect yourself from getting a cold is to practice good hygiene.  Avoid oral or hand contact with people with cold symptoms.  Wash your hands often if contact occurs.  There is no clear evidence that  vitamin C, vitamin E, echinacea, or exercise reduces the chance of developing a cold. However, it is always recommended to get plenty of rest, exercise, and practice good nutrition. Contact a health care provider if:  You are getting worse rather than better.  Your symptoms are not controlled by medicine.  You have chills.  You have worsening shortness of breath.  You have brown or red mucus.  You have yellow or brown nasal discharge.  You have pain in your face, especially when you bend forward.  You have a fever.  You have swollen neck glands.  You have pain while swallowing.  You have white areas in the back of your throat. Get help right away if:  You have severe or persistent: ? Headache. ? Ear pain. ? Sinus pain. ? Chest pain.  You have chronic lung disease and any of the following: ? Wheezing. ? Prolonged cough. ? Coughing up blood. ? A change in your usual mucus.  You have a stiff neck.  You have changes in your: ? Vision. ? Hearing. ? Thinking. ? Mood. This information is not intended to replace advice given to you by your health care provider. Make sure you discuss any questions you have with your health care provider. Document Released: 10/22/2000 Document Revised: 12/30/2015 Document Reviewed: 08/03/2013 Elsevier Interactive Patient Education  [employer].

## 2017-07-29 ENCOUNTER — Other Ambulatory Visit: Payer: Self-pay | Admitting: Family Medicine

## 2017-08-04 ENCOUNTER — Ambulatory Visit: Payer: 59 | Admitting: Family Medicine

## 2017-08-06 ENCOUNTER — Ambulatory Visit: Payer: 59 | Admitting: Family Medicine

## 2017-08-06 ENCOUNTER — Encounter: Payer: Self-pay | Admitting: Family Medicine

## 2017-08-06 VITALS — BP 106/65 | HR 79 | Temp 98.5°F | Resp 14 | Ht 66.0 in | Wt 155.0 lb

## 2017-08-06 DIAGNOSIS — F411 Generalized anxiety disorder: Secondary | ICD-10-CM | POA: Diagnosis not present

## 2017-08-06 DIAGNOSIS — Z720 Tobacco use: Secondary | ICD-10-CM

## 2017-08-06 DIAGNOSIS — E348 Other specified endocrine disorders: Secondary | ICD-10-CM | POA: Diagnosis not present

## 2017-08-06 DIAGNOSIS — I10 Essential (primary) hypertension: Secondary | ICD-10-CM | POA: Diagnosis not present

## 2017-08-06 DIAGNOSIS — J0100 Acute maxillary sinusitis, unspecified: Secondary | ICD-10-CM | POA: Diagnosis not present

## 2017-08-06 LAB — POCT GLYCOSYLATED HEMOGLOBIN (HGB A1C): HEMOGLOBIN A1C: 6.7

## 2017-08-06 MED ORDER — AMOXICILLIN-POT CLAVULANATE 875-125 MG PO TABS: 1.0000 | ORAL_TABLET | Freq: Two times a day (BID) | ORAL | 0 refills | Status: DC

## 2017-08-06 NOTE — Progress Notes (Signed)
Subjective:    CC: HTN, IFG, Anxiety  HPI:  Hypertension- Pt denies chest pain, SOB, dizziness, or heart palpitations.  Taking meds as directed w/o problems.  Denies medication side effects.    Impaired fasting glucose-no increased thirst or urination. No symptoms consistent with hypoglycemia.  F/U anxiety -she says she constantly stays stressed and anxious but is happy with her citalopram the way it is.  She does not want to increase it or adjust it.  She has continued to have upper respiratory symptoms persistent for at least 3-4 weeks at this point time.  In fact she came in about 2 weeks ago.  She is had persistent nasal congestion ear popping headaches.  No fevers chills or sweats.  She had a lot of pain over the maxillary sinus area and yesterday her teeth were hurting.  She is been trying disc and decongestants but cannot get the mucus to break up  She would really like to quit smoking.  She says she feels like it really affects her self-esteem because she cannot seem to quit.  She is tried everything from patches to Chantix in the past.  The Chantix was effective but when she finished the course she ended up smoking again.  In the last time she tried to fill it it was extremely expensive and could not afford it.  Past medical history, Surgical history, Family history not pertinant except as noted below, Social history, Allergies, and medications have been entered into the medical record, reviewed, and corrections made.   Review of Systems: No fevers, chills, night sweats, weight loss, chest pain, or shortness of breath. Otherwise neg except for HPI  Objective:    General: Well Developed, well nourished, and in no acute distress.  Neuro: Alert and oriented x3, extra-ocular muscles intact, sensation grossly intact.  HEENT: Normocephalic, atraumatic, OP is clear, TMs and canals are clear bilaterally, no cervical lymphadenopathy   Skin: Warm and dry, no rashes. Cardiac: Regular rate and  rhythm, no murmurs rubs or gallops, no lower extremity edema.  Respiratory: Clear to auscultation bilaterally. Not using accessory muscles, speaking in full sentences.   Impression and Recommendations:    HTN -well controlled.  Continue current regimen.  IFG -well.  Hemoglobin A1c up to 6.7 today.  We discussed options.  She really feels strongly that she does not want to go medication and really wants to work to get back down to where it was which was 5.8 previously.  Anxiety - continue celexa.  Thought anxiety not well controlled. She doesn't want to make any changes today.    Acute sinusitis-we will treat with Augmentin.  Call if not significantly better in 1 week.  Tobacco abuse off-offered to send over Chantix again to see if it may be more cost effective this year.   Time spent 40 min, > 50% spent counseling about BP, IFG, anxiety, acute sinusitis, tobacco abuse.

## 2017-08-06 NOTE — Patient Instructions (Signed)

## 2017-09-10 ENCOUNTER — Other Ambulatory Visit: Payer: Self-pay | Admitting: Physician Assistant

## 2017-09-10 ENCOUNTER — Other Ambulatory Visit: Payer: Self-pay | Admitting: Family Medicine

## 2017-09-10 DIAGNOSIS — M5416 Radiculopathy, lumbar region: Secondary | ICD-10-CM

## 2017-09-29 ENCOUNTER — Telehealth: Payer: Self-pay

## 2017-09-29 NOTE — Telephone Encounter (Signed)
Office received letter from [facility] stating that pt's Losartan may have been affected by recent recall.  Called and left pt a msg requesting she call me back and let me know if she has confirmed her meds were ok with her [pharmacy or if she wishes to change medications.  Call back info provided

## 2017-10-01 NOTE — Telephone Encounter (Signed)
Placed follow up call with pt. Pt states pharmacy advised her that none of their Losartan was recalled.  Pt wishes to stay on Losartan as long as it is available, does not want change in therapy.  No further needs at this time. Pt to keep her F/U appt with Dr Linford Arnold in June

## 2017-10-27 ENCOUNTER — Other Ambulatory Visit: Payer: Self-pay | Admitting: *Deleted

## 2017-10-27 MED ORDER — LORAZEPAM 0.5 MG PO TABS: ORAL_TABLET | ORAL | 1 refills | Status: DC

## 2017-10-27 NOTE — Telephone Encounter (Signed)
Pt looked up in [place] database she last refilled medication on 03/10/17. # 60. Will fwd to pcp for review and signature.Heath GoldBarkley, Salvador Bigbee Lynetta, CMA

## 2017-11-05 ENCOUNTER — Ambulatory Visit: Payer: 59 | Admitting: Family Medicine

## 2017-11-05 ENCOUNTER — Encounter: Payer: Self-pay | Admitting: Family Medicine

## 2017-11-05 VITALS — BP 127/75 | HR 80 | Ht 66.0 in | Wt 146.0 lb

## 2017-11-05 DIAGNOSIS — I10 Essential (primary) hypertension: Secondary | ICD-10-CM

## 2017-11-05 DIAGNOSIS — E348 Other specified endocrine disorders: Secondary | ICD-10-CM

## 2017-11-05 LAB — POCT GLYCOSYLATED HEMOGLOBIN (HGB A1C): Hemoglobin A1C: 5.5 % (ref 4.0–5.6)

## 2017-11-05 NOTE — Progress Notes (Signed)
Subjective:    CC: BP  HPI:  Hypertension- Pt denies chest pain, SOB, dizziness, or heart palpitations.  Taking meds as directed w/o problems.  Denies medication side effects.    Impaired fasting glucose-no increased thirst or urination. No symptoms consistent with hypoglycemia.  Last hemoglobin A1c was elevated at 6.7.  We had a serious discussion about getting on track with diet and increasing activity level.  She says she is been outdoors and doing a lot and yard work and staying very active.  She has really cut out a lot of the sweet desserts that she was snacking on previously.  She is really increased her vegetable intake.  Still struggling a little bit with reading labels and figure out what how much carbs she should be eating but otherwise is actually doing really well.  She actually feels like her metabolism has improved.  Past medical history, Surgical history, Family history not pertinant except as noted below, Social history, Allergies, and medications have been entered into the medical record, reviewed, and corrections made.   Review of Systems: No fevers, chills, night sweats, weight loss, chest pain, or shortness of breath.   Objective:    General: Well Developed, well nourished, and in no acute distress.  Neuro: Alert and oriented x3, extra-ocular muscles intact, sensation grossly intact.  HEENT: Normocephalic, atraumatic  Skin: Warm and dry, no rashes. Cardiac: Regular rate and rhythm, no murmurs rubs or gallops, no lower extremity edema.  Respiratory: Clear to auscultation bilaterally. Not using accessory muscles, speaking in full sentences.   Impression and Recommendations:    HTN - Well controlled. Continue current regimen. Follow up in  6 months.    IFG -   Hemoglobin A1c down to 5.5 today back in the normal range as is absolutely fantastic.  Just encouraged her to stick with her dietary changes that she is made and stay active.  She will need to have a plan for the  winter to stay active as she will have as much yard work etc. to do at that time.  I will see her back in 3 to 6 months.

## 2017-11-23 ENCOUNTER — Other Ambulatory Visit: Payer: Self-pay | Admitting: Sports Medicine

## 2017-11-23 DIAGNOSIS — M25562 Pain in left knee: Secondary | ICD-10-CM

## 2017-11-23 DIAGNOSIS — M79662 Pain in left lower leg: Secondary | ICD-10-CM

## 2017-11-28 ENCOUNTER — Other Ambulatory Visit: Payer: Self-pay | Admitting: Sports Medicine

## 2017-11-28 DIAGNOSIS — M79662 Pain in left lower leg: Secondary | ICD-10-CM

## 2017-12-17 ENCOUNTER — Encounter: Payer: Self-pay | Admitting: Sports Medicine

## 2017-12-17 ENCOUNTER — Ambulatory Visit: Payer: 59 | Admitting: Sports Medicine

## 2017-12-17 DIAGNOSIS — M25562 Pain in left knee: Secondary | ICD-10-CM | POA: Diagnosis not present

## 2017-12-17 DIAGNOSIS — M5416 Radiculopathy, lumbar region: Secondary | ICD-10-CM | POA: Diagnosis not present

## 2017-12-17 DIAGNOSIS — M79662 Pain in left lower leg: Secondary | ICD-10-CM

## 2017-12-17 MED ORDER — CELECOXIB 200 MG PO CAPS: 200.0000 mg | ORAL_CAPSULE | Freq: Two times a day (BID) | ORAL | 3 refills | Status: DC

## 2017-12-17 MED ORDER — TRAMADOL HCL 50 MG PO TABS: 50.0000 mg | ORAL_TABLET | Freq: Every day | ORAL | 0 refills | Status: DC | PRN

## 2017-12-17 MED ORDER — GABAPENTIN 100 MG PO CAPS: ORAL_CAPSULE | ORAL | 0 refills | Status: DC

## 2017-12-17 NOTE — Progress Notes (Signed)
Subjective:    CC: Back pain  HPI: I have not seen Breanna Gomez in some time, she has L2-L3 degenerative disc disease with a large disc protrusion, she has been well controlled with gabapentin 3 times daily, tramadol a proximally once per day and Celebrex twice a day, she is running out of medication, has not see me in a while and needed a follow-up visit.  No bowel or bladder dysfunction, saddle numbness, constitutional symptoms.  I reviewed the past medical history, family history, social history, surgical history, and allergies today and no changes were needed.  Please see the problem list section below in epic for further details.  Past Medical History: Past Medical History:  Diagnosis Date  . Goiter, euthyroid    Past Surgical History: Past Surgical History:  Procedure Laterality Date  . BREAST SURGERY     cyst removes from r breast  . removed cyst from tailbone     removed cyst   . TONSILLECTOMY  57 yrs old   Social History: Social History   Socioeconomic History  . Marital status: Married    Spouse name: Not on file  . Number of children: 2  . Years of education: Not on file  . Highest education level: Not on file  Occupational History    Employer: [employer]  Social Needs  . Financial resource strain: Not on file  . Food insecurity:    Worry: Not on file    Inability: Not on file  . Transportation needs:    Medical: Not on file    Non-medical: Not on file  Tobacco Use  . Smoking status: Current Every Day Smoker    Packs/day: 0.50    Years: 40.00    Pack years: 20.00    Types: Cigarettes  . Smokeless tobacco: Never Used  Substance and Sexual Activity  . Alcohol use: No    Alcohol/week: 0.0 standard drinks  . Drug use: No  . Sexual activity: Yes    Partners: Male  Lifestyle  . Physical activity:    Days per week: Not on file    Minutes per session: Not on file  . Stress: Not on file  Relationships  . Social connections:    Talks on phone: Not on file     Gets together: Not on file    Attends religious service: Not on file    Active member of club or organization: Not on file    Attends meetings of clubs or organizations: Not on file    Relationship status: Not on file  Other Topics Concern  . Not on file  Social History Narrative   No regular exercise.  Daily caffeine intake.    Family History: Family History  Problem Relation Age of Onset  . Hypertension Mother   . Hyperlipidemia Mother   . Alzheimer's disease Mother   . Hyperlipidemia Father   . Hypertension Father   . Diabetes Father   . Hyperlipidemia Sister   . Hypertension Sister   . Diabetes Sister    Allergies: Allergies  Allergen Reactions  . Nsaids Other (See Comments)    GI ulcers.    Medications: See med rec.  Review of Systems: No fevers, chills, night sweats, weight loss, chest pain, or shortness of breath.   Objective:    General: Well Developed, well nourished, and in no acute distress.  Neuro: Alert and oriented x3, extra-ocular muscles intact, sensation grossly intact.  HEENT: Normocephalic, atraumatic, pupils equal round reactive to light, neck supple,  no masses, no lymphadenopathy, thyroid nonpalpable.  Skin: Warm and dry, no rashes. Cardiac: Regular rate and rhythm, no murmurs rubs or gallops, no lower extremity edema.  Respiratory: Clear to auscultation bilaterally. Not using accessory muscles, speaking in full sentences.  Impression and Recommendations:    Right lumbar radiculopathy Right L2-L3 disc protrusion with clear compression of the right L2 nerve root, bilateral anterior thigh symptoms. Symptoms were fairly well controlled on Celebrex twice a day, gabapentin 3 times daily, and occasional tramadol. Refilling these, we can proceed with an epidural should she desire.  I spent 25 minutes with this patient, greater than 50% was face-to-face time counseling regarding the above diagnoses ___________________________________________ Ihor Austin. Benjamin Stain, M.D., ABFM., CAQSM. Primary Care and Sports Medicine  [facility]  Adjunct Instructor of Family Medicine  [employer]

## 2017-12-17 NOTE — Progress Notes (Signed)
114/72

## 2017-12-17 NOTE — Assessment & Plan Note (Signed)
Right L2-L3 disc protrusion with clear compression of the right L2 nerve root, bilateral anterior thigh symptoms. Symptoms were fairly well controlled on Celebrex twice a day, gabapentin 3 times daily, and occasional tramadol. Refilling these, we can proceed with an epidural should she desire.

## 2018-01-04 ENCOUNTER — Other Ambulatory Visit: Payer: Self-pay | Admitting: Family Medicine

## 2018-01-14 ENCOUNTER — Encounter: Payer: Self-pay | Admitting: Sports Medicine

## 2018-01-14 ENCOUNTER — Ambulatory Visit: Payer: 59 | Admitting: Sports Medicine

## 2018-01-14 DIAGNOSIS — M5416 Radiculopathy, lumbar region: Secondary | ICD-10-CM | POA: Diagnosis not present

## 2018-01-14 MED ORDER — CYCLOBENZAPRINE HCL 10 MG PO TABS: ORAL_TABLET | ORAL | 0 refills | Status: DC

## 2018-01-14 NOTE — Assessment & Plan Note (Addendum)
There is clear contact of the right L2-L3 disc with the right L2 nerve root. Patient does have bilateral anterior thigh symptoms. Hip exam is for the most part normal, she does have some tenderness over her right greater trochanter but this is not concordant with her usual pain. Continue Celebrex twice a day, occasional tramadol. Gabapentin is too sedating, discontinue this, she would like to try Flexeril. I would like to set her up at this point with a right L2-L3 interlaminar epidural with Dr. Laurian Brim just up the road.  Return to see me 1 month after the injection.

## 2018-01-14 NOTE — Progress Notes (Signed)
Subjective:    CC: Back and leg pain  HPI: Breanna Gomez is a pleasant 57 year old female with mild lumbar degenerative disc disease, we have been treating her for mostly right-sided anterior thigh and back pain, her symptoms were very well controlled with physical therapy, Celebrex, gabapentin and and occasional tramadol.  More recently her pain has been worsening, gabapentin is also too sedating at this point.  No bowel or bladder dysfunction, saddle numbness, constitutional symptoms.  I reviewed the past medical history, family history, social history, surgical history, and allergies today and no changes were needed.  Please see the problem list section below in epic for further details.  Past Medical History: Past Medical History:  Diagnosis Date  . Goiter, euthyroid    Past Surgical History: Past Surgical History:  Procedure Laterality Date  . BREAST SURGERY     cyst removes from r breast  . removed cyst from tailbone     removed cyst   . TONSILLECTOMY  57 yrs old   Social History: Social History   Socioeconomic History  . Marital status: Married    Spouse name: Not on file  . Number of children: 2  . Years of education: Not on file  . Highest education level: Not on file  Occupational History    Employer: [employer]  Social Needs  . Financial resource strain: Not on file  . Food insecurity:    Worry: Not on file    Inability: Not on file  . Transportation needs:    Medical: Not on file    Non-medical: Not on file  Tobacco Use  . Smoking status: Current Every Day Smoker    Packs/day: 0.50    Years: 40.00    Pack years: 20.00    Types: Cigarettes  . Smokeless tobacco: Never Used  Substance and Sexual Activity  . Alcohol use: No    Alcohol/week: 0.0 standard drinks  . Drug use: No  . Sexual activity: Yes    Partners: Male  Lifestyle  . Physical activity:    Days per week: Not on file    Minutes per session: Not on file  . Stress: Not on file  Relationships   . Social connections:    Talks on phone: Not on file    Gets together: Not on file    Attends religious service: Not on file    Active member of club or organization: Not on file    Attends meetings of clubs or organizations: Not on file    Relationship status: Not on file  Other Topics Concern  . Not on file  Social History Narrative   No regular exercise.  Daily caffeine intake.    Family History: Family History  Problem Relation Age of Onset  . Hypertension Mother   . Hyperlipidemia Mother   . Alzheimer's disease Mother   . Hyperlipidemia Father   . Hypertension Father   . Diabetes Father   . Hyperlipidemia Sister   . Hypertension Sister   . Diabetes Sister    Allergies: Allergies  Allergen Reactions  . Nsaids Other (See Comments)    GI ulcers.    Medications: See med rec.  Review of Systems: No fevers, chills, night sweats, weight loss, chest pain, or shortness of breath.   Objective:    General: Well Developed, well nourished, and in no acute distress.  Neuro: Alert and oriented x3, extra-ocular muscles intact, sensation grossly intact.  HEENT: Normocephalic, atraumatic, pupils equal round reactive to light, neck supple,  no masses, no lymphadenopathy, thyroid nonpalpable.  Skin: Warm and dry, no rashes. Cardiac: Regular rate and rhythm, no murmurs rubs or gallops, no lower extremity edema.  Respiratory: Clear to auscultation bilaterally. Not using accessory muscles, speaking in full sentences.  Impression and Recommendations:    Right lumbar radiculopathy There is clear contact of the right L2-L3 disc with the right L2 nerve root. Patient does have bilateral anterior thigh symptoms. Hip exam is for the most part normal, she does have some tenderness over her right greater trochanter but this is not concordant with her usual pain. Continue Celebrex twice a day, occasional tramadol. Gabapentin is too sedating, discontinue this, she would like to try  Flexeril. I would like to set her up at this point with a right L2-L3 interlaminar epidural with Dr. Laurian Brim just up the road.  Return to see me 1 month after the injection.  I spent 25 minutes with this patient, greater than 50% was face-to-face time counseling regarding the above diagnoses including treatment plan, and prognosis as well as discussion of interventional treatment protocol. ___________________________________________ Ihor Austin. Benjamin Stain, M.D., ABFM., CAQSM. Primary Care and Sports Medicine [facility]  Adjunct Instructor of Family Medicine  [employer]

## 2018-01-29 ENCOUNTER — Other Ambulatory Visit: Payer: Self-pay | Admitting: Family Medicine

## 2018-02-10 ENCOUNTER — Encounter: Payer: Self-pay | Admitting: Family Medicine

## 2018-02-10 ENCOUNTER — Ambulatory Visit (INDEPENDENT_AMBULATORY_CARE_PROVIDER_SITE_OTHER): Payer: 59 | Admitting: Family Medicine

## 2018-02-10 VITALS — BP 133/71 | HR 85 | Ht 66.0 in | Wt 146.0 lb

## 2018-02-10 DIAGNOSIS — Z23 Encounter for immunization: Secondary | ICD-10-CM | POA: Diagnosis not present

## 2018-02-10 DIAGNOSIS — E049 Nontoxic goiter, unspecified: Secondary | ICD-10-CM

## 2018-02-10 DIAGNOSIS — Z0000 Encounter for general adult medical examination without abnormal findings: Secondary | ICD-10-CM | POA: Diagnosis not present

## 2018-02-10 DIAGNOSIS — E785 Hyperlipidemia, unspecified: Secondary | ICD-10-CM | POA: Diagnosis not present

## 2018-02-10 DIAGNOSIS — J019 Acute sinusitis, unspecified: Secondary | ICD-10-CM

## 2018-02-10 DIAGNOSIS — I10 Essential (primary) hypertension: Secondary | ICD-10-CM

## 2018-02-10 LAB — COMPLETE METABOLIC PANEL WITH GFR
AG Ratio: 2 (calc) (ref 1.0–2.5)
ALT: 12 U/L (ref 6–29)
AST: 14 U/L (ref 10–35)
Albumin: 4.5 g/dL (ref 3.6–5.1)
Alkaline phosphatase (APISO): 60 U/L (ref 33–130)
BUN: 14 mg/dL (ref 7–25)
CALCIUM: 10.5 mg/dL — AB (ref 8.6–10.4)
CO2: 29 mmol/L (ref 20–32)
Chloride: 101 mmol/L (ref 98–110)
Creat: 0.78 mg/dL (ref 0.50–1.05)
GFR, EST AFRICAN AMERICAN: 96 mL/min/{1.73_m2} (ref >=60)
GFR, EST NON AFRICAN AMERICAN: 83 mL/min/{1.73_m2} (ref >=60)
GLUCOSE: 106 mg/dL — AB (ref 65–99)
Globulin: 2.3 g/dL (calc) (ref 1.9–3.7)
Potassium: 5.2 mmol/L (ref 3.5–5.3)
Sodium: 136 mmol/L (ref 135–146)
TOTAL PROTEIN: 6.8 g/dL (ref 6.1–8.1)
Total Bilirubin: 0.6 mg/dL (ref 0.2–1.2)

## 2018-02-10 LAB — LIPID PANEL
Cholesterol: 179 mg/dL (ref <200)
HDL: 56 mg/dL (ref >50)
LDL Cholesterol (Calc): 98 mg/dL (calc)
NON-HDL CHOLESTEROL (CALC): 123 mg/dL (ref <130)
Total CHOL/HDL Ratio: 3.2 (calc) (ref <5.0)
Triglycerides: 150 mg/dL — ABNORMAL HIGH (ref <150)

## 2018-02-10 LAB — TSH: TSH: 1.28 mIU/L (ref 0.40–4.50)

## 2018-02-10 MED ORDER — AMOXICILLIN-POT CLAVULANATE 875-125 MG PO TABS: 1.0000 | ORAL_TABLET | Freq: Two times a day (BID) | ORAL | 0 refills | Status: DC

## 2018-02-10 MED ORDER — ALBUTEROL SULFATE HFA 108 (90 BASE) MCG/ACT IN AERS: INHALATION_SPRAY | RESPIRATORY_TRACT | 4 refills | Status: DC

## 2018-02-10 MED ORDER — ATORVASTATIN CALCIUM 40 MG PO TABS: 40.0000 mg | ORAL_TABLET | Freq: Every day | ORAL | 3 refills | Status: DC

## 2018-02-10 NOTE — Progress Notes (Signed)
Subjective:     Breanna Gomez is a 57 y.o. female and is here for a comprehensive physical exam. The patient reports no problems. She has a cough she can't seem to get rid of .  She says is been going on for about 3 to 4 weeks.  She is had a lot of intermittent nasal congestion and postnasal drip and drainage.  Cough feels like it is more in her upper chest.  No shortness of breath or wheezing.  No fevers chills or sweats.  She has tried claritin and zyrtec and has been on her Flonase.   She is only taking her citalopram every 3-4 days.  Mammogram scheduled at [facility].   Social History   Socioeconomic History  . Marital status: Married    Spouse name: Not on file  . Number of children: 2  . Years of education: Not on file  . Highest education level: Not on file  Occupational History    Employer: [employer]  Social Needs  . Financial resource strain: Not on file  . Food insecurity:    Worry: Not on file    Inability: Not on file  . Transportation needs:    Medical: Not on file    Non-medical: Not on file  Tobacco Use  . Smoking status: Current Every Day Smoker    Packs/day: 0.50    Years: 40.00    Pack years: 20.00    Types: Cigarettes  . Smokeless tobacco: Never Used  Substance and Sexual Activity  . Alcohol use: No    Alcohol/week: 0.0 standard drinks  . Drug use: No  . Sexual activity: Yes    Partners: Male  Lifestyle  . Physical activity:    Days per week: Not on file    Minutes per session: Not on file  . Stress: Not on file  Relationships  . Social connections:    Talks on phone: Not on file    Gets together: Not on file    Attends religious service: Not on file    Active member of club or organization: Not on file    Attends meetings of clubs or organizations: Not on file    Relationship status: Not on file  . Intimate partner violence:    Fear of current or ex partner: Not on file    Emotionally abused: Not on file    Physically abused: Not on file    Forced sexual activity: Not on file  Other Topics Concern  . Not on file  Social History Narrative   No regular exercise.  Daily caffeine intake.    Health Maintenance  Topic Date Due  . INFLUENZA VACCINE  09/03/2018 (Originally 12/10/2017)  . TETANUS/TDAP  09/15/2018  . COLONOSCOPY  11/23/2018  . MAMMOGRAM  02/21/2019  . PAP SMEAR  02/04/2022  . Hepatitis C Screening  Completed  . HIV Screening  Completed    The following portions of the patient's history were reviewed and updated as appropriate: allergies, current medications, past family history, past medical history, past social history, past surgical history and problem list.  Review of Systems A comprehensive review of systems was negative.   Objective:    BP 133/71   Pulse 85   Ht 5\' 6"  (1.676 m)   Wt 146 lb (66.2 kg)   LMP 05/12/2000   SpO2 100%   BMI 23.57 kg/m  General appearance: alert, cooperative and appears stated age Head: Normocephalic, without obvious abnormality, atraumatic Eyes: conj clear, EOMI,  PEERLA Ears: normal TM's and external ear canals both ears Nose: Nares normal. Septum midline. Mucosa normal. No drainage or sinus tenderness. Throat: lips, mucosa, and tongue normal; teeth and gums normal Neck: no adenopathy, no carotid bruit, no JVD, supple, symmetrical, trachea midline and thyroid not enlarged, symmetric, no tenderness/mass/nodules Back: symmetric, no curvature. ROM normal. No CVA tenderness. Lungs: clear to auscultation bilaterally Breasts: normal appearance, no masses or tenderness Heart: regular rate and rhythm, S1, S2 normal, no murmur, click, rub or gallop Abdomen: soft, non-tender; bowel sounds normal; no masses,  no organomegaly Extremities: extremities normal, atraumatic, no cyanosis or edema Pulses: 2+ and symmetric Skin: Skin color, texture, turgor normal. No rashes or lesions Lymph nodes: Cervical, supraclavicular, and axillary nodes normal. Neurologic: Alert and oriented X 3,  normal strength and tone. Normal symmetric reflexes. Normal coordination and gait    Assessment:    Healthy female exam.      Plan:      See After Visit Summary for Counseling Recommendations   Keep up a regular exercise program and make sure you are eating a healthy diet Try to eat 4 servings of dairy a day, or if you are lactose intolerant take a calcium with vitamin D daily.  Your vaccines are up to date.   Hyperlipidemia - switching your pravastatin to atorvastatin.  The 40 mg atorvastatin-is equivalent to the 80 mg pravastatin and should be a little smaller. Discussed Shingrex. She will check on coverage   Acute sinusitis-we will treat with Augmentin.  Continue with Flonase.  Call if not better in 1 week.

## 2018-02-10 NOTE — Patient Instructions (Addendum)
Okay to stop your citalopram and your aspirin. I am switching your pravastatin to atorvastatin.  The 40 mg atorvastatin-is equivalent to the 80 mg pravastatin and should be a little smaller.  Health Maintenance, Female Adopting a healthy lifestyle and getting preventive care can go a long way to promote health and wellness. Talk with your health care provider about what schedule of regular examinations is right for you. This is a good chance for you to check in with your provider about disease prevention and staying healthy. In between checkups, there are plenty of things you can do on your own. Experts have done a lot of research about which lifestyle changes and preventive measures are most likely to keep you healthy. Ask your health care provider for more information. Weight and diet Eat a healthy diet  Be sure to include plenty of vegetables, fruits, low-fat dairy products, and lean protein.  Do not eat a lot of foods high in solid fats, added sugars, or salt.  Get regular exercise. This is one of the most important things you can do for your health. ? Most adults should exercise for at least 150 minutes each week. The exercise should increase your heart rate and make you sweat (moderate-intensity exercise). ? Most adults should also do strengthening exercises at least twice a week. This is in addition to the moderate-intensity exercise.  Maintain a healthy weight  Body mass index (BMI) is a measurement that can be used to identify possible weight problems. It estimates body fat based on height and weight. Your health care provider can help determine your BMI and help you achieve or maintain a healthy weight.  For females 85 years of age and older: ? A BMI below 18.5 is considered underweight. ? A BMI of 18.5 to 24.9 is normal. ? A BMI of 25 to 29.9 is considered overweight. ? A BMI of 30 and above is considered obese.  Watch levels of cholesterol and blood lipids  You should start  having your blood tested for lipids and cholesterol at 59 years of age, then have this test every 5 years.  You may need to have your cholesterol levels checked more often if: ? Your lipid or cholesterol levels are high. ? You are older than 59 years of age. ? You are at high risk for heart disease.  Cancer screening Lung Cancer  Lung cancer screening is recommended for adults 2-38 years old who are at high risk for lung cancer because of a history of smoking.  A yearly low-dose CT scan of the lungs is recommended for people who: ? Currently smoke. ? Have quit within the past 15 years. ? Have at least a 30-pack-year history of smoking. A pack year is smoking an average of one pack of cigarettes a day for 1 year.  Yearly screening should continue until it has been 15 years since you quit.  Yearly screening should stop if you develop a health problem that would prevent you from having lung cancer treatment.  Breast Cancer  Practice breast self-awareness. This means understanding how your breasts normally appear and feel.  It also means doing regular breast self-exams. Let your health care provider know about any changes, no matter how small.  If you are in your 20s or 30s, you should have a clinical breast exam (CBE) by a health care provider every 1-3 years as part of a regular health exam.  If you are 10 or older, have a CBE every year. Also consider  having a breast X-ray (mammogram) every year.  If you have a family history of breast cancer, talk to your health care provider about genetic screening.  If you are at high risk for breast cancer, talk to your health care provider about having an MRI and a mammogram every year.  Breast cancer gene (BRCA) assessment is recommended for women who have family members with BRCA-related cancers. BRCA-related cancers include: ? Breast. ? Ovarian. ? Tubal. ? Peritoneal cancers.  Results of the assessment will determine the need for  genetic counseling and BRCA1 and BRCA2 testing.  Cervical Cancer Your health care provider may recommend that you be screened regularly for cancer of the pelvic organs (ovaries, uterus, and vagina). This screening involves a pelvic examination, including checking for microscopic changes to the surface of your cervix (Pap test). You may be encouraged to have this screening done every 3 years, beginning at age 48.  For women ages 22-65, health care providers may recommend pelvic exams and Pap testing every 3 years, or they may recommend the Pap and pelvic exam, combined with testing for human papilloma virus (HPV), every 5 years. Some types of HPV increase your risk of cervical cancer. Testing for HPV may also be done on women of any age with unclear Pap test results.  Other health care providers may not recommend any screening for nonpregnant women who are considered low risk for pelvic cancer and who do not have symptoms. Ask your health care provider if a screening pelvic exam is right for you.  If you have had past treatment for cervical cancer or a condition that could lead to cancer, you need Pap tests and screening for cancer for at least 20 years after your treatment. If Pap tests have been discontinued, your risk factors (such as having a new sexual partner) need to be reassessed to determine if screening should resume. Some women have medical problems that increase the chance of getting cervical cancer. In these cases, your health care provider may recommend more frequent screening and Pap tests.  Colorectal Cancer  This type of cancer can be detected and often prevented.  Routine colorectal cancer screening usually begins at 59 years of age and continues through 59 years of age.  Your health care provider may recommend screening at an earlier age if you have risk factors for colon cancer.  Your health care provider may also recommend using home test kits to check for hidden blood in the  stool.  A small camera at the end of a tube can be used to examine your colon directly (sigmoidoscopy or colonoscopy). This is done to check for the earliest forms of colorectal cancer.  Routine screening usually begins at age 65.  Direct examination of the colon should be repeated every 5-10 years through 59 years of age. However, you may need to be screened more often if early forms of precancerous polyps or small growths are found.  Skin Cancer  Check your skin from head to toe regularly.  Tell your health care provider about any new moles or changes in moles, especially if there is a change in a mole's shape or color.  Also tell your health care provider if you have a mole that is larger than the size of a pencil eraser.  Always use sunscreen. Apply sunscreen liberally and repeatedly throughout the day.  Protect yourself by wearing long sleeves, pants, a wide-brimmed hat, and sunglasses whenever you are outside.  Heart disease, diabetes, and high blood pressure  High blood pressure causes heart disease and increases the risk of stroke. High blood pressure is more likely to develop in: ? People who have blood pressure in the high end of the normal range (130-139/85-89 mm Hg). ? People who are overweight or obese. ? People who are African American.  If you are 14-51 years of age, have your blood pressure checked every 3-5 years. If you are 50 years of age or older, have your blood pressure checked every year. You should have your blood pressure measured twice-once when you are at a hospital or clinic, and once when you are not at a hospital or clinic. Record the average of the two measurements. To check your blood pressure when you are not at a hospital or clinic, you can use: ? An automated blood pressure machine at a pharmacy. ? A home blood pressure monitor.  If you are between 6 years and 19 years old, ask your health care provider if you should take aspirin to prevent  strokes.  Have regular diabetes screenings. This involves taking a blood sample to check your fasting blood sugar level. ? If you are at a normal weight and have a low risk for diabetes, have this test once every three years after 59 years of age. ? If you are overweight and have a high risk for diabetes, consider being tested at a younger age or more often. Preventing infection Hepatitis B  If you have a higher risk for hepatitis B, you should be screened for this virus. You are considered at high risk for hepatitis B if: ? You were born in a country where hepatitis B is common. Ask your health care provider which countries are considered high risk. ? Your parents were born in a high-risk country, and you have not been immunized against hepatitis B (hepatitis B vaccine). ? You have HIV or AIDS. ? You use needles to inject street drugs. ? You live with someone who has hepatitis B. ? You have had sex with someone who has hepatitis B. ? You get hemodialysis treatment. ? You take certain medicines for conditions, including cancer, organ transplantation, and autoimmune conditions.  Hepatitis C  Blood testing is recommended for: ? Everyone born from [birth year] through 1967. ? Anyone with known risk factors for hepatitis C.  Sexually transmitted infections (STIs)  You should be screened for sexually transmitted infections (STIs) including gonorrhea and chlamydia if: ? You are sexually active and are younger than 59 years of age. ? You are older than 59 years of age and your health care provider tells you that you are at risk for this type of infection. ? Your sexual activity has changed since you were last screened and you are at an increased risk for chlamydia or gonorrhea. Ask your health care provider if you are at risk.  If you do not have HIV, but are at risk, it may be recommended that you take a prescription medicine daily to prevent HIV infection. This is called pre-exposure prophylaxis  (PrEP). You are considered at risk if: ? You are sexually active and do not regularly use condoms or know the HIV status of your partner(s). ? You take drugs by injection. ? You are sexually active with a partner who has HIV.  Talk with your health care provider about whether you are at high risk of being infected with HIV. If you choose to begin PrEP, you should first be tested for HIV. You should then be tested every 3 months  for as long as you are taking PrEP. Pregnancy  If you are premenopausal and you may become pregnant, ask your health care provider about preconception counseling.  If you may become pregnant, take 400 to 800 micrograms (mcg) of folic acid every day.  If you want to prevent pregnancy, talk to your health care provider about birth control (contraception). Osteoporosis and menopause  Osteoporosis is a disease in which the bones lose minerals and strength with aging. This can result in serious bone fractures. Your risk for osteoporosis can be identified using a bone density scan.  If you are 8 years of age or older, or if you are at risk for osteoporosis and fractures, ask your health care provider if you should be screened.  Ask your health care provider whether you should take a calcium or vitamin D supplement to lower your risk for osteoporosis.  Menopause may have certain physical symptoms and risks.  Hormone replacement therapy may reduce some of these symptoms and risks. Talk to your health care provider about whether hormone replacement therapy is right for you. Follow these instructions at home:  Schedule regular health, dental, and eye exams.  Stay current with your immunizations.  Do not use any tobacco products including cigarettes, chewing tobacco, or electronic cigarettes.  If you are pregnant, do not drink alcohol.  If you are breastfeeding, limit how much and how often you drink alcohol.  Limit alcohol intake to no more than 1 drink per day for  nonpregnant women. One drink equals 12 ounces of beer, 5 ounces of wine, or 1 ounces of hard liquor.  Do not use street drugs.  Do not share needles.  Ask your health care provider for help if you need support or information about quitting drugs.  Tell your health care provider if you often feel depressed.  Tell your health care provider if you have ever been abused or do not feel safe at home. This information is not intended to replace advice given to you by your health care provider. Make sure you discuss any questions you have with your health care provider. Document Released: 11/11/2010 Document Revised: 10/04/2015 Document Reviewed: 01/30/2015 Elsevier Interactive Patient Education  [employer].

## 2018-02-15 ENCOUNTER — Other Ambulatory Visit: Payer: Self-pay

## 2018-02-15 ENCOUNTER — Other Ambulatory Visit: Payer: Self-pay | Admitting: Family Medicine

## 2018-02-15 NOTE — Progress Notes (Signed)
Per last lab result:  "Metabolic panel is OK, except your calcium is borderline. Recommend recheck that in 1 months."  Order placed and faxed to lab

## 2018-03-05 LAB — HM MAMMOGRAPHY

## 2018-03-12 ENCOUNTER — Other Ambulatory Visit: Payer: Self-pay | Admitting: Sports Medicine

## 2018-03-12 DIAGNOSIS — M79662 Pain in left lower leg: Secondary | ICD-10-CM

## 2018-03-12 DIAGNOSIS — M5416 Radiculopathy, lumbar region: Secondary | ICD-10-CM

## 2018-03-22 ENCOUNTER — Telehealth: Payer: Self-pay | Admitting: Family Medicine

## 2018-03-22 NOTE — Telephone Encounter (Signed)
Pt called clinic stating she was advised if she wanted to restart the Citalopram 20mg  tabs, to just let PCP know. She would like to restart this Rx. Routing.

## 2018-03-23 ENCOUNTER — Encounter: Payer: Self-pay | Admitting: Sports Medicine

## 2018-03-23 ENCOUNTER — Ambulatory Visit: Payer: 59 | Admitting: Sports Medicine

## 2018-03-23 DIAGNOSIS — M5416 Radiculopathy, lumbar region: Secondary | ICD-10-CM | POA: Diagnosis not present

## 2018-03-23 MED ORDER — CELECOXIB 200 MG PO CAPS: ORAL_CAPSULE | ORAL | 2 refills | Status: DC

## 2018-03-23 MED ORDER — CITALOPRAM HYDROBROMIDE 20 MG PO TABS: ORAL_TABLET | ORAL | 3 refills | Status: DC

## 2018-03-23 NOTE — Telephone Encounter (Signed)
rx sent

## 2018-03-23 NOTE — Assessment & Plan Note (Signed)
Clear contact of the right L2-L3 disc with a right L2 nerve root. We discontinued tramadol, she was too sedated. Celebrex 1-2 times a day with an occasional tramadol and Flexeril at night seems to provide good relief of her pain, she did see Dr. Laurian Brim but was not needing any injections. I think we found a good medical regimen, she can return to see me as needed.

## 2018-03-23 NOTE — Progress Notes (Signed)
Subjective:    CC: Follow-up  HPI: This is a pleasant 57 year old female, we have been treating her for lumbar spondylosis, initially suspected to be an L2 radiculitis, overall she has done well, did not actually need the epidural.  I reviewed the past medical history, family history, social history, surgical history, and allergies today and no changes were needed.  Please see the problem list section below in epic for further details.  Past Medical History: Past Medical History:  Diagnosis Date  . Goiter, euthyroid    Past Surgical History: Past Surgical History:  Procedure Laterality Date  . BREAST SURGERY     cyst removes from r breast  . removed cyst from tailbone     removed cyst   . TONSILLECTOMY  57 yrs old   Social History: Social History   Socioeconomic History  . Marital status: Married    Spouse name: Not on file  . Number of children: 2  . Years of education: Not on file  . Highest education level: Not on file  Occupational History    Employer: [employer]  Social Needs  . Financial resource strain: Not on file  . Food insecurity:    Worry: Not on file    Inability: Not on file  . Transportation needs:    Medical: Not on file    Non-medical: Not on file  Tobacco Use  . Smoking status: Current Every Day Smoker    Packs/day: 0.50    Years: 40.00    Pack years: 20.00    Types: Cigarettes  . Smokeless tobacco: Never Used  Substance and Sexual Activity  . Alcohol use: No    Alcohol/week: 0.0 standard drinks  . Drug use: No  . Sexual activity: Yes    Partners: Male  Lifestyle  . Physical activity:    Days per week: Not on file    Minutes per session: Not on file  . Stress: Not on file  Relationships  . Social connections:    Talks on phone: Not on file    Gets together: Not on file    Attends religious service: Not on file    Active member of club or organization: Not on file    Attends meetings of clubs or organizations: Not on file   Relationship status: Not on file  Other Topics Concern  . Not on file  Social History Narrative   No regular exercise.  Daily caffeine intake.    Family History: Family History  Problem Relation Age of Onset  . Hypertension Mother   . Hyperlipidemia Mother   . Alzheimer's disease Mother   . Hyperlipidemia Father   . Hypertension Father   . Diabetes Father   . Hyperlipidemia Sister   . Hypertension Sister   . Diabetes Sister    Allergies: Allergies  Allergen Reactions  . Nsaids Other (See Comments)    GI ulcers.    Medications: See med rec.  Review of Systems: No fevers, chills, night sweats, weight loss, chest pain, or shortness of breath.   Objective:    General: Well Developed, well nourished, and in no acute distress.  Neuro: Alert and oriented x3, extra-ocular muscles intact, sensation grossly intact.  HEENT: Normocephalic, atraumatic, pupils equal round reactive to light, neck supple, no masses, no lymphadenopathy, thyroid nonpalpable.  Skin: Warm and dry, no rashes. Cardiac: Regular rate and rhythm, no murmurs rubs or gallops, no lower extremity edema.  Respiratory: Clear to auscultation bilaterally. Not using accessory muscles, speaking in  full sentences.  Impression and Recommendations:    Right lumbar radiculopathy Clear contact of the right L2-L3 disc with a right L2 nerve root. We discontinued tramadol, she was too sedated. Celebrex 1-2 times a day with an occasional tramadol and Flexeril at night seems to provide good relief of her pain, she did see Dr. Laurian Brim'Toole but was not needing any injections. I think we found a good medical regimen, she can return to see me as needed. ___________________________________________ [name], M.D., ABFM., CAQSM. Primary Care and Sports Medicine [facility]  Adjunct Professor of Family Medicine  [employer]

## 2018-03-23 NOTE — Telephone Encounter (Signed)
Pt advised.

## 2018-03-24 ENCOUNTER — Encounter: Payer: Self-pay | Admitting: Family Medicine

## 2018-03-24 LAB — COMPLETE METABOLIC PANEL WITH GFR
AG Ratio: 2 (calc) (ref 1.0–2.5)
ALBUMIN MSPROF: 4.4 g/dL (ref 3.6–5.1)
ALT: 14 U/L (ref 6–29)
AST: 15 U/L (ref 10–35)
Alkaline phosphatase (APISO): 70 U/L (ref 33–130)
BUN: 20 mg/dL (ref 7–25)
CALCIUM: 10.7 mg/dL — AB (ref 8.6–10.4)
CO2: 30 mmol/L (ref 20–32)
CREATININE: 0.71 mg/dL (ref 0.50–1.05)
Chloride: 101 mmol/L (ref 98–110)
GFR, EST NON AFRICAN AMERICAN: 93 mL/min/{1.73_m2} (ref >=60)
GFR, Est African American: 108 mL/min/{1.73_m2} (ref >=60)
GLOBULIN: 2.2 g/dL (ref 1.9–3.7)
Glucose, Bld: 97 mg/dL (ref 65–99)
Potassium: 4.4 mmol/L (ref 3.5–5.3)
SODIUM: 138 mmol/L (ref 135–146)
Total Bilirubin: 0.4 mg/dL (ref 0.2–1.2)
Total Protein: 6.6 g/dL (ref 6.1–8.1)

## 2018-03-26 LAB — PTH, INTACT AND CALCIUM
CALCIUM: 10.5 mg/dL — AB (ref 8.6–10.4)
PTH: 30 pg/mL (ref 14–64)

## 2018-04-26 ENCOUNTER — Ambulatory Visit: Payer: 59 | Admitting: Family Medicine

## 2018-04-28 ENCOUNTER — Other Ambulatory Visit: Payer: Self-pay

## 2018-04-28 MED ORDER — LORAZEPAM 0.5 MG PO TABS: ORAL_TABLET | ORAL | 0 refills | Status: DC

## 2018-04-28 NOTE — Telephone Encounter (Signed)
[facility] ref[facility] for lorazepam for Breanna Gomez. Please advise.

## 2018-04-29 ENCOUNTER — Encounter: Payer: Self-pay | Admitting: Family Medicine

## 2018-05-06 ENCOUNTER — Ambulatory Visit: Payer: 59 | Admitting: Family Medicine

## 2018-05-20 ENCOUNTER — Other Ambulatory Visit: Payer: Self-pay

## 2018-05-21 LAB — PTH, INTACT AND CALCIUM
Calcium: 10.4 mg/dL (ref 8.6–10.4)
PTH: 41 pg/mL (ref 14–64)

## 2018-06-02 ENCOUNTER — Other Ambulatory Visit: Payer: Self-pay | Admitting: *Deleted

## 2018-06-02 ENCOUNTER — Other Ambulatory Visit: Payer: Self-pay | Admitting: Family Medicine

## 2018-06-02 DIAGNOSIS — I10 Essential (primary) hypertension: Secondary | ICD-10-CM

## 2018-06-02 DIAGNOSIS — F411 Generalized anxiety disorder: Secondary | ICD-10-CM

## 2018-06-02 MED ORDER — LORAZEPAM 0.5 MG PO TABS: ORAL_TABLET | ORAL | 0 refills | Status: DC

## 2018-06-02 NOTE — Telephone Encounter (Signed)
Routing to pcp for signature...Saundra Gin Lynetta  

## 2018-06-02 NOTE — Telephone Encounter (Signed)
Routing to pcp for signature.Melo Stauber Lynetta, CMA  

## 2018-07-22 ENCOUNTER — Other Ambulatory Visit: Payer: Self-pay | Admitting: Family Medicine

## 2018-07-22 DIAGNOSIS — F411 Generalized anxiety disorder: Secondary | ICD-10-CM

## 2018-07-23 ENCOUNTER — Telehealth: Payer: Self-pay

## 2018-07-23 NOTE — Telephone Encounter (Signed)
Call Breanna Gomez and let her know that I got a refill request for her lorazepam.  She is taking way too much over a short period of time.  Normally this last her several months and so I am not sure if something has changed.  If she is experiencing some increase in anxiety and she needs to make an appointment so that we can discuss controller medications instead of taking the lorazepam more frequently which can be habit-forming and dangerous for her health.  This really should truly be used as a rescue medication and should last several months for 1 refill.

## 2018-07-23 NOTE — Telephone Encounter (Signed)
lvm advising pt of recommendations. And asked that she rtn call to schedule an appt to discuss medication.Heath Gold, CMA

## 2018-07-23 NOTE — Telephone Encounter (Signed)
Rumi called and states she doesn't need to the Lorazepam at the moment.

## 2018-08-12 ENCOUNTER — Encounter: Payer: Self-pay | Admitting: Family Medicine

## 2018-08-12 ENCOUNTER — Other Ambulatory Visit: Payer: Self-pay

## 2018-08-12 ENCOUNTER — Ambulatory Visit (INDEPENDENT_AMBULATORY_CARE_PROVIDER_SITE_OTHER): Payer: 59 | Admitting: Family Medicine

## 2018-08-12 VITALS — BP 124/67 | HR 77 | Ht 66.0 in | Wt 147.0 lb

## 2018-08-12 DIAGNOSIS — E348 Other specified endocrine disorders: Secondary | ICD-10-CM | POA: Diagnosis not present

## 2018-08-12 DIAGNOSIS — F411 Generalized anxiety disorder: Secondary | ICD-10-CM | POA: Diagnosis not present

## 2018-08-12 DIAGNOSIS — I10 Essential (primary) hypertension: Secondary | ICD-10-CM

## 2018-08-12 NOTE — Progress Notes (Signed)
  Virtual Visit via Telephone Note  I connected with Breanna Gomez on 08/12/18 at  2:00 PM EDT by telephone and verified that I am speaking with the correct person using two identifiers.   I discussed the limitations, risks, security and privacy concerns of performing an evaluation and management service by telephone and the availability of in person appointments. I also discussed with the patient that there may be a patient responsible charge related to this service. The patient expressed understanding and agreed to proceed.   Subjective:    CC: 6 mo f/u bP and glucose.   HPI:  Hypertension- Pt denies chest pain, SOB, dizziness, or heart palpitations. No swelling.  Taking meds as directed w/o problems.  Denies medication side effects.    Impaired fasting glucose-no increased thirst or urination. No symptoms consistent with hypoglycemia. Says she knows she needs to drink more water.   F/U GAD - she feels like she worries all the time.  She feels like she is a little worse while trying to work from home.  Using her citalopram.  .  She hasn't had to use her rescue medication in 2 weeks.   She has dry eyes and working on the computer all day has been bothering her eyes.    Past medical history, Surgical history, Family history not pertinant except as noted below, Social history, Allergies, and medications have been entered into the medical record, reviewed, and corrections made.   Review of Systems: No fevers, chills, night sweats, weight loss, chest pain, or shortness of breath.   Objective:    General: Speaking clearly in complete sentences without any shortness of breath.  Alert and oriented x3.  Normal judgment. No apparent acute distress.    Impression and Recommendations:    HTN - Well controlled. Continue current regimen. Follow up in 4- 6 months    GAD -PHQ 9 score of 12 and gad 7 score of 14. She is happy with regimen. Wants to stay on her current dose.    IFG - plan to  recheck A1C in a couple of months. Can consider cinnamon capsules to help lower sugar. Continue to work on diet and exercise.  Labs ordered so she can go the summer at her convenience once restrictions have been lifted for the current pandemic.    I discussed the assessment and treatment plan with the patient. The patient was provided an opportunity to ask questions and all were answered. The patient agreed with the plan and demonstrated an understanding of the instructions.   The patient was advised to call back or seek an in-person evaluation if the symptoms worsen or if the condition fails to improve as anticipated.  I provided 21 minutes of non-face-to-face time during this encounter.   Nani Gasser, MD

## 2018-10-08 ENCOUNTER — Ambulatory Visit: Payer: 59 | Admitting: Sports Medicine

## 2018-10-08 ENCOUNTER — Encounter: Payer: Self-pay | Admitting: Sports Medicine

## 2018-10-08 DIAGNOSIS — M5416 Radiculopathy, lumbar region: Secondary | ICD-10-CM

## 2018-10-08 DIAGNOSIS — M19012 Primary osteoarthritis, left shoulder: Secondary | ICD-10-CM | POA: Insufficient documentation

## 2018-10-08 DIAGNOSIS — M7552 Bursitis of left shoulder: Secondary | ICD-10-CM

## 2018-10-08 MED ORDER — TRAMADOL HCL 50 MG PO TABS: 50.0000 mg | ORAL_TABLET | Freq: Every day | ORAL | 0 refills | Status: DC | PRN

## 2018-10-08 MED ORDER — CYCLOBENZAPRINE HCL 10 MG PO TABS: ORAL_TABLET | ORAL | 0 refills | Status: DC

## 2018-10-08 NOTE — Assessment & Plan Note (Signed)
Severe pain for 3 weeks after doing overhead presses with dumbbells. This was unfortunately her first day in the gym. Subacromial injection, rehab exercises, x-rays, return to see me in 4 weeks, MRI if no better.

## 2018-10-08 NOTE — Progress Notes (Signed)
Subjective:    CC: Left shoulder pain  HPI: This is a pleasant 58 year old female, she decided to get healthy and trying to work out, on her first day working out with her for shoulder press she felt immediate pain in her left deltoid, it has worsened over the past 3 weeks, worse with overhead activities, waking her from sleep, severe, persistent.  She is now starting to develop paresthesias running down her arm into her fingertips.  I reviewed the past medical history, family history, social history, surgical history, and allergies today and no changes were needed.  Please see the problem list section below in epic for further details.  Past Medical History: Past Medical History:  Diagnosis Date  . Goiter, euthyroid    Past Surgical History: Past Surgical History:  Procedure Laterality Date  . BREAST SURGERY     cyst removes from r breast  . removed cyst from tailbone     removed cyst   . TONSILLECTOMY  58 yrs old   Social History: Social History   Socioeconomic History  . Marital status: Married    Spouse name: Not on file  . Number of children: 2  . Years of education: Not on file  . Highest education level: Not on file  Occupational History    Employer: [employer]  Social Needs  . Financial resource strain: Not on file  . Food insecurity:    Worry: Not on file    Inability: Not on file  . Transportation needs:    Medical: Not on file    Non-medical: Not on file  Tobacco Use  . Smoking status: Current Every Day Smoker    Packs/day: 0.50    Years: 40.00    Pack years: 20.00    Types: Cigarettes  . Smokeless tobacco: Never Used  Substance and Sexual Activity  . Alcohol use: No    Alcohol/week: 0.0 standard drinks  . Drug use: No  . Sexual activity: Yes    Partners: Male  Lifestyle  . Physical activity:    Days per week: Not on file    Minutes per session: Not on file  . Stress: Not on file  Relationships  . Social connections:    Talks on phone: Not  on file    Gets together: Not on file    Attends religious service: Not on file    Active member of club or organization: Not on file    Attends meetings of clubs or organizations: Not on file    Relationship status: Not on file  Other Topics Concern  . Not on file  Social History Narrative   No regular exercise.  Daily caffeine intake.    Family History: Family History  Problem Relation Age of Onset  . Hypertension Mother   . Hyperlipidemia Mother   . Alzheimer's disease Mother   . Hyperlipidemia Father   . Hypertension Father   . Diabetes Father   . Hyperlipidemia Sister   . Hypertension Sister   . Diabetes Sister    Allergies: Allergies  Allergen Reactions  . Nsaids Other (See Comments)    GI ulcers.  GI ulcers.    Medications: See med rec.  Review of Systems: No fevers, chills, night sweats, weight loss, chest pain, or shortness of breath.   Objective:    General: Well Developed, well nourished, and in no acute distress.  Neuro: Alert and oriented x3, extra-ocular muscles intact, sensation grossly intact.  HEENT: Normocephalic, atraumatic, pupils equal round reactive  to light, neck supple, no masses, no lymphadenopathy, thyroid nonpalpable.  Skin: Warm and dry, no rashes. Cardiac: Regular rate and rhythm, no murmurs rubs or gallops, no lower extremity edema.  Respiratory: Clear to auscultation bilaterally. Not using accessory muscles, speaking in full sentences. Left shoulder: Inspection reveals no abnormalities, atrophy or asymmetry. Palpation is normal with no tenderness over AC joint or bicipital groove. ROM is full in all planes. Rotator cuff strength normal throughout. Positive Neer and Hawkin's tests, empty can. Speeds and Yergason's tests normal. No labral pathology noted with negative Obrien's, negative crank, negative clunk, and good stability. Normal scapular function observed. No painful arc and no drop arm sign. No apprehension sign  Procedure:  Real-time Ultrasound Guided injection of the left subacromial bursa Device: GE Logiq E  Verbal informed consent obtained.  Time-out conducted.  Noted no overlying erythema, induration, or other signs of local infection.  Skin prepped in a sterile fashion.  Local anesthesia: Topical Ethyl chloride.  With sterile technique and under real time ultrasound guidance:  1 cc Kenalog 40, 1 cc lidocaine, 1 cc bupivacaine injected easily Completed without difficulty  Pain immediately resolved suggesting accurate placement of the medication.  Advised to call if fevers/chills, erythema, induration, drainage, or persistent bleeding.  Images permanently stored and available for review in the ultrasound unit.  Impression: Technically successful ultrasound guided injection.  Impression and Recommendations:    Acute bursitis of left shoulder Severe pain for 3 weeks after doing overhead presses with dumbbells. This was unfortunately her first day in the gym. Subacromial injection, rehab exercises, x-rays, return to see me in 4 weeks, MRI if no better.   ___________________________________________ Ihor Austin. Benjamin Stain, M.D., ABFM., CAQSM. Primary Care and Sports Medicine [facility]  Adjunct Professor of Family Medicine  [employer]

## 2018-10-20 ENCOUNTER — Other Ambulatory Visit: Payer: Self-pay | Admitting: Family Medicine

## 2018-10-20 DIAGNOSIS — F411 Generalized anxiety disorder: Secondary | ICD-10-CM

## 2018-11-05 ENCOUNTER — Ambulatory Visit (INDEPENDENT_AMBULATORY_CARE_PROVIDER_SITE_OTHER): Payer: 59

## 2018-11-05 ENCOUNTER — Encounter: Payer: Self-pay | Admitting: Sports Medicine

## 2018-11-05 ENCOUNTER — Ambulatory Visit: Payer: 59 | Admitting: Sports Medicine

## 2018-11-05 ENCOUNTER — Other Ambulatory Visit: Payer: Self-pay

## 2018-11-05 DIAGNOSIS — M5412 Radiculopathy, cervical region: Secondary | ICD-10-CM

## 2018-11-05 DIAGNOSIS — M25512 Pain in left shoulder: Secondary | ICD-10-CM

## 2018-11-05 DIAGNOSIS — G950 Syringomyelia and syringobulbia: Secondary | ICD-10-CM

## 2018-11-05 DIAGNOSIS — M7552 Bursitis of left shoulder: Secondary | ICD-10-CM

## 2018-11-05 MED ORDER — PREDNISONE 50 MG PO TABS: ORAL_TABLET | ORAL | 0 refills | Status: DC

## 2018-11-05 NOTE — Assessment & Plan Note (Signed)
Persistent pain in spite of greater than 6 weeks of physician directed conservative measures including injection. Proceeding with noncontrast left shoulder MRI

## 2018-11-05 NOTE — Progress Notes (Addendum)
Subjective:    CC: Follow-up  HPI: Breanna Gomez is a very pleasant 58 year old female, we treated her for a left shoulder bursitis, she was also having some radicular symptoms at the last visit.  I injected her subacromial bursa, she did improve, but has persistent discomfort with abduction, as well as persistent paresthesias down the arm to the fingertips.  Pain is moderate, persistent, localized over the deltoid with radiation down to the hands and fingertips, worse with overhead activities.  I reviewed the past medical history, family history, social history, surgical history, and allergies today and no changes were needed.  Please see the problem list section below in epic for further details.  Past Medical History: Past Medical History:  Diagnosis Date  . Goiter, euthyroid    Past Surgical History: Past Surgical History:  Procedure Laterality Date  . BREAST SURGERY     cyst removes from r breast  . removed cyst from tailbone     removed cyst   . TONSILLECTOMY  58 yrs old   Social History: Social History   Socioeconomic History  . Marital status: Married    Spouse name: Not on file  . Number of children: 2  . Years of education: Not on file  . Highest education level: Not on file  Occupational History    Employer: [employer]  Social Needs  . Financial resource strain: Not on file  . Food insecurity    Worry: Not on file    Inability: Not on file  . Transportation needs    Medical: Not on file    Non-medical: Not on file  Tobacco Use  . Smoking status: Current Every Day Smoker    Packs/day: 0.50    Years: 40.00    Pack years: 20.00    Types: Cigarettes  . Smokeless tobacco: Never Used  Substance and Sexual Activity  . Alcohol use: No    Alcohol/week: 0.0 standard drinks  . Drug use: No  . Sexual activity: Yes    Partners: Male  Lifestyle  . Physical activity    Days per week: Not on file    Minutes per session: Not on file  . Stress: Not on file   Relationships  . Social Musicianconnections    Talks on phone: Not on file    Gets together: Not on file    Attends religious service: Not on file    Active member of club or organization: Not on file    Attends meetings of clubs or organizations: Not on file    Relationship status: Not on file  Other Topics Concern  . Not on file  Social History Narrative   No regular exercise.  Daily caffeine intake.    Family History: Family History  Problem Relation Age of Onset  . Hypertension Mother   . Hyperlipidemia Mother   . Alzheimer's disease Mother   . Hyperlipidemia Father   . Hypertension Father   . Diabetes Father   . Hyperlipidemia Sister   . Hypertension Sister   . Diabetes Sister    Allergies: Allergies  Allergen Reactions  . Nsaids Other (See Comments)    GI ulcers.  GI ulcers.    Medications: See med rec.  Review of Systems: No fevers, chills, night sweats, weight loss, chest pain, or shortness of breath.   Objective:    General: Well Developed, well nourished, and in no acute distress.  Neuro: Alert and oriented x3, extra-ocular muscles intact, sensation grossly intact.  HEENT: Normocephalic, atraumatic, pupils  equal round reactive to light, neck supple, no masses, no lymphadenopathy, thyroid nonpalpable.  Skin: Warm and dry, no rashes. Cardiac: Regular rate and rhythm, no murmurs rubs or gallops, no lower extremity edema.  Respiratory: Clear to auscultation bilaterally. Not using accessory muscles, speaking in full sentences. Left shoulder: Continues to have a positive Hawkins, Neer sign.  Impression and Recommendations:    Acute bursitis of left shoulder Persistent pain in spite of greater than 6 weeks of physician directed conservative measures including injection. Proceeding with noncontrast left shoulder MRI  Radiculitis of left cervical region X-rays, MRI, prednisone.  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  This does need a referral to neurosurgery, they may consider shunting.  She also needs a cervical spine MRI with contrast for follow-up to fully ensure that there is no mass causing the syrinx.   ___________________________________________ Gwen Her. Dianah Field, M.D., ABFM., CAQSM. Primary Care and Sports Medicine [facility]  Adjunct Professor of [place] [employer]

## 2018-11-05 NOTE — Assessment & Plan Note (Signed)
X-rays, MRI, prednisone.

## 2018-11-06 LAB — LIPID PANEL
Cholesterol: 156 mg/dL (ref <200)
HDL: 58 mg/dL (ref >=50)
LDL Cholesterol (Calc): 74 mg/dL (calc)
Non-HDL Cholesterol (Calc): 98 mg/dL (calc) (ref <130)
Total CHOL/HDL Ratio: 2.7 (calc) (ref <5.0)
Triglycerides: 162 mg/dL — ABNORMAL HIGH (ref <150)

## 2018-11-06 LAB — COMPLETE METABOLIC PANEL WITH GFR
AG Ratio: 1.9 (calc) (ref 1.0–2.5)
ALT: 20 U/L (ref 6–29)
AST: 18 U/L (ref 10–35)
Albumin: 4.3 g/dL (ref 3.6–5.1)
Alkaline phosphatase (APISO): 65 U/L (ref 37–153)
BUN: 17 mg/dL (ref 7–25)
CO2: 29 mmol/L (ref 20–32)
Calcium: 10.5 mg/dL — ABNORMAL HIGH (ref 8.6–10.4)
Chloride: 99 mmol/L (ref 98–110)
Creat: 0.71 mg/dL (ref 0.50–1.05)
GFR, Est African American: 108 mL/min/{1.73_m2} (ref >=60)
GFR, Est Non African American: 93 mL/min/{1.73_m2} (ref >=60)
Globulin: 2.3 g/dL (calc) (ref 1.9–3.7)
Glucose, Bld: 100 mg/dL — ABNORMAL HIGH (ref 65–99)
Potassium: 4.1 mmol/L (ref 3.5–5.3)
Sodium: 135 mmol/L (ref 135–146)
Total Bilirubin: 0.7 mg/dL (ref 0.2–1.2)
Total Protein: 6.6 g/dL (ref 6.1–8.1)

## 2018-11-06 LAB — HEMOGLOBIN A1C
Hgb A1c MFr Bld: 5.7 % of total Hgb — ABNORMAL HIGH (ref <5.7)
Mean Plasma Glucose: 117 (calc)
eAG (mmol/L): 6.5 (calc)

## 2018-11-15 ENCOUNTER — Ambulatory Visit (INDEPENDENT_AMBULATORY_CARE_PROVIDER_SITE_OTHER): Payer: 59

## 2018-11-15 ENCOUNTER — Other Ambulatory Visit: Payer: Self-pay

## 2018-11-15 DIAGNOSIS — M7552 Bursitis of left shoulder: Secondary | ICD-10-CM

## 2018-11-17 ENCOUNTER — Encounter: Payer: Self-pay | Admitting: Sports Medicine

## 2018-11-21 ENCOUNTER — Other Ambulatory Visit: Payer: 59

## 2018-11-22 MED ORDER — DIAZEPAM 5 MG PO TABS: ORAL_TABLET | ORAL | 0 refills | Status: DC

## 2018-11-24 ENCOUNTER — Other Ambulatory Visit: Payer: Self-pay | Admitting: Family Medicine

## 2018-11-28 ENCOUNTER — Ambulatory Visit (INDEPENDENT_AMBULATORY_CARE_PROVIDER_SITE_OTHER): Payer: 59

## 2018-11-28 ENCOUNTER — Other Ambulatory Visit: Payer: Self-pay

## 2018-11-28 DIAGNOSIS — M5412 Radiculopathy, cervical region: Secondary | ICD-10-CM

## 2018-11-28 DIAGNOSIS — M7552 Bursitis of left shoulder: Secondary | ICD-10-CM | POA: Diagnosis not present

## 2018-11-28 DIAGNOSIS — M25512 Pain in left shoulder: Secondary | ICD-10-CM | POA: Diagnosis not present

## 2018-11-29 ENCOUNTER — Other Ambulatory Visit: Payer: Self-pay | Admitting: Sports Medicine

## 2018-11-29 ENCOUNTER — Ambulatory Visit (INDEPENDENT_AMBULATORY_CARE_PROVIDER_SITE_OTHER): Payer: 59

## 2018-11-29 ENCOUNTER — Encounter: Payer: Self-pay | Admitting: Sports Medicine

## 2018-11-29 DIAGNOSIS — G950 Syringomyelia and syringobulbia: Secondary | ICD-10-CM | POA: Diagnosis not present

## 2018-11-29 MED ORDER — GADOBUTROL 1 MMOL/ML IV SOLN
6.0000 mL | Freq: Once | INTRAVENOUS | Status: AC | PRN
  Administered 2018-11-29: 6 mL via INTRAVENOUS

## 2018-11-29 MED ORDER — DIAZEPAM 5 MG PO TABS: ORAL_TABLET | ORAL | 0 refills | Status: DC

## 2018-11-29 NOTE — Addendum Note (Signed)
Addended by: Silverio Decamp on: 11/29/2018 09:35 AM   Modules accepted: Orders

## 2018-11-29 NOTE — Assessment & Plan Note (Signed)
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  This does need a referral to neurosurgery, they may consider shunting.  She also needs a cervical spine MRI with contrast for follow-up to fully ensure that there is no mass causing the syrinx.

## 2018-12-13 ENCOUNTER — Encounter: Payer: Self-pay | Admitting: Sports Medicine

## 2018-12-13 NOTE — Telephone Encounter (Signed)
Please look into this, I did the referral already.

## 2018-12-14 NOTE — Telephone Encounter (Signed)
Jenny Reichmann would you look into this, it sounds like they have not yet received the referral.

## 2018-12-19 IMAGING — DX DG LUMBAR SPINE COMPLETE 4+V
5 series · 5 of 5 positions shown · non-contrast
Comparison: None.

CLINICAL DATA: Sciatica right-sided pain

EXAM:
LUMBAR SPINE - COMPLETE 4+ VIEW

[l-spine ap]
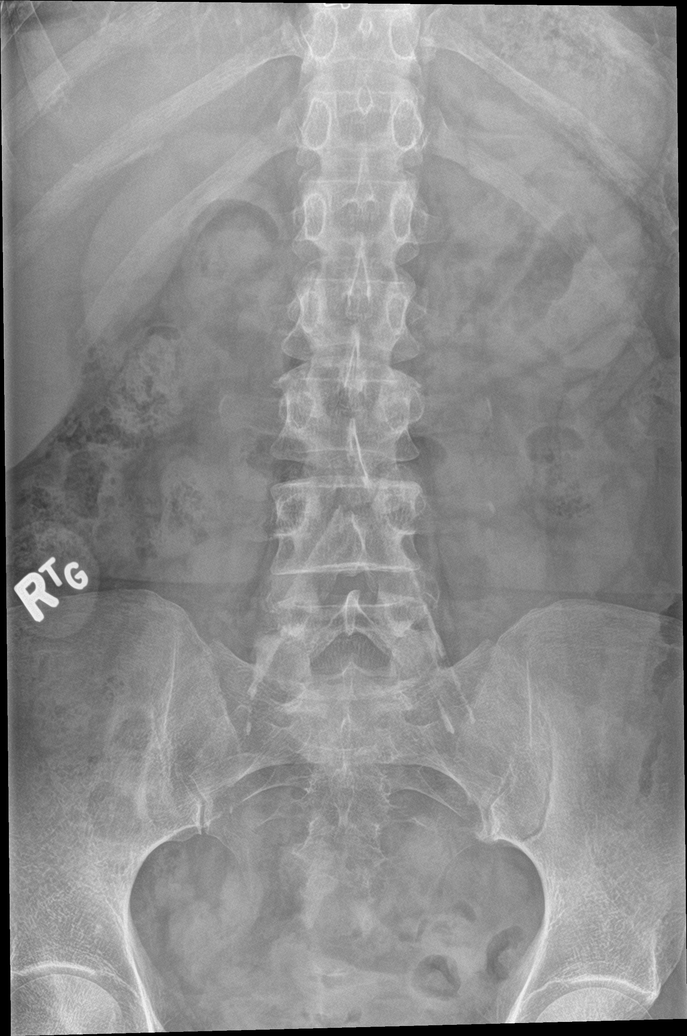

[l-spine obl (1 of 2)]
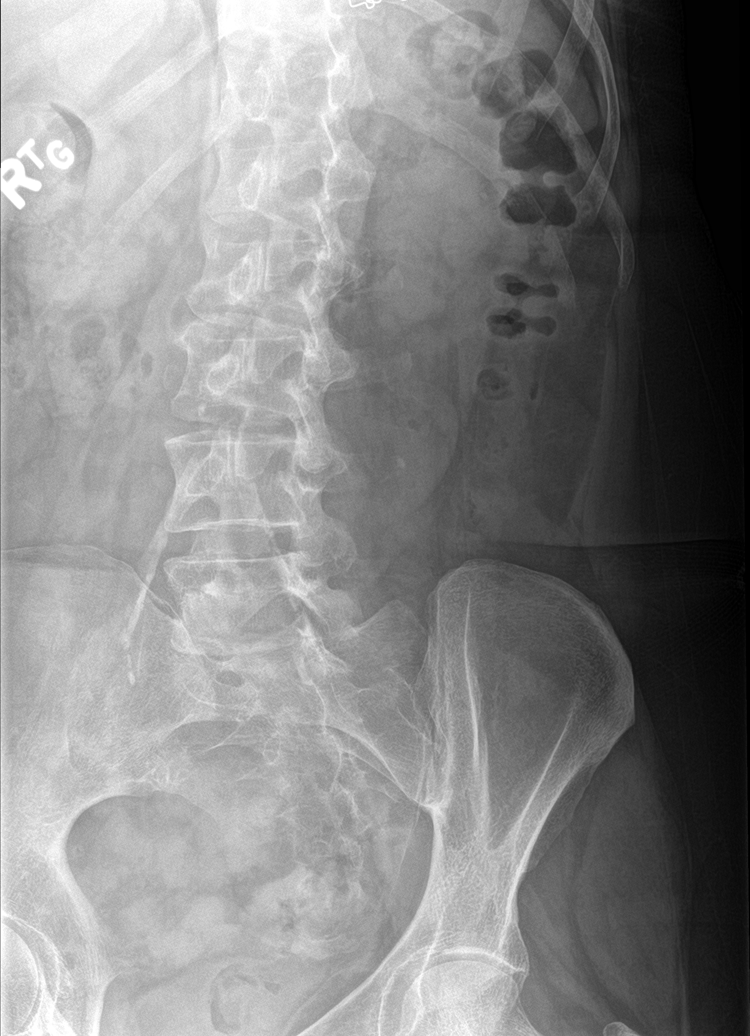

[l-spine obl (2 of 2)]
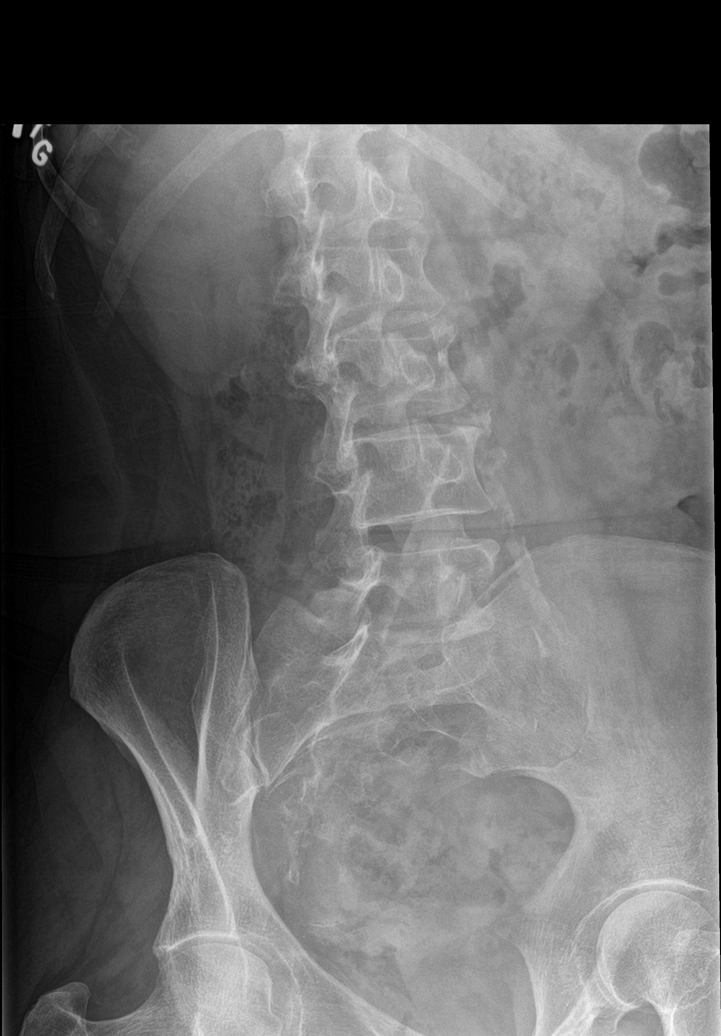

[l-spine lat]
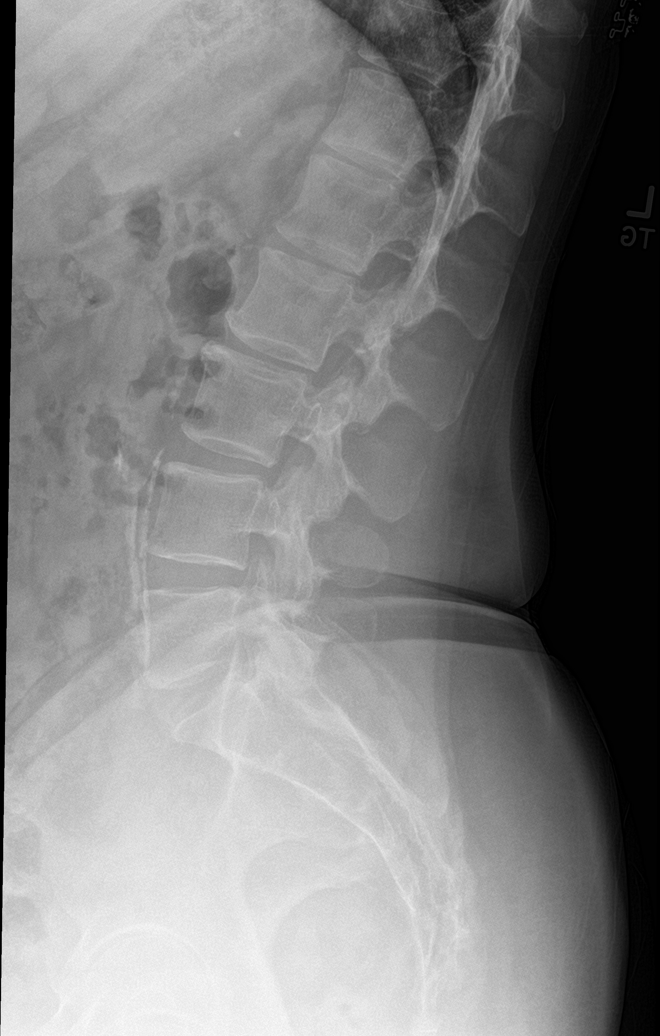

[l-spine spot]
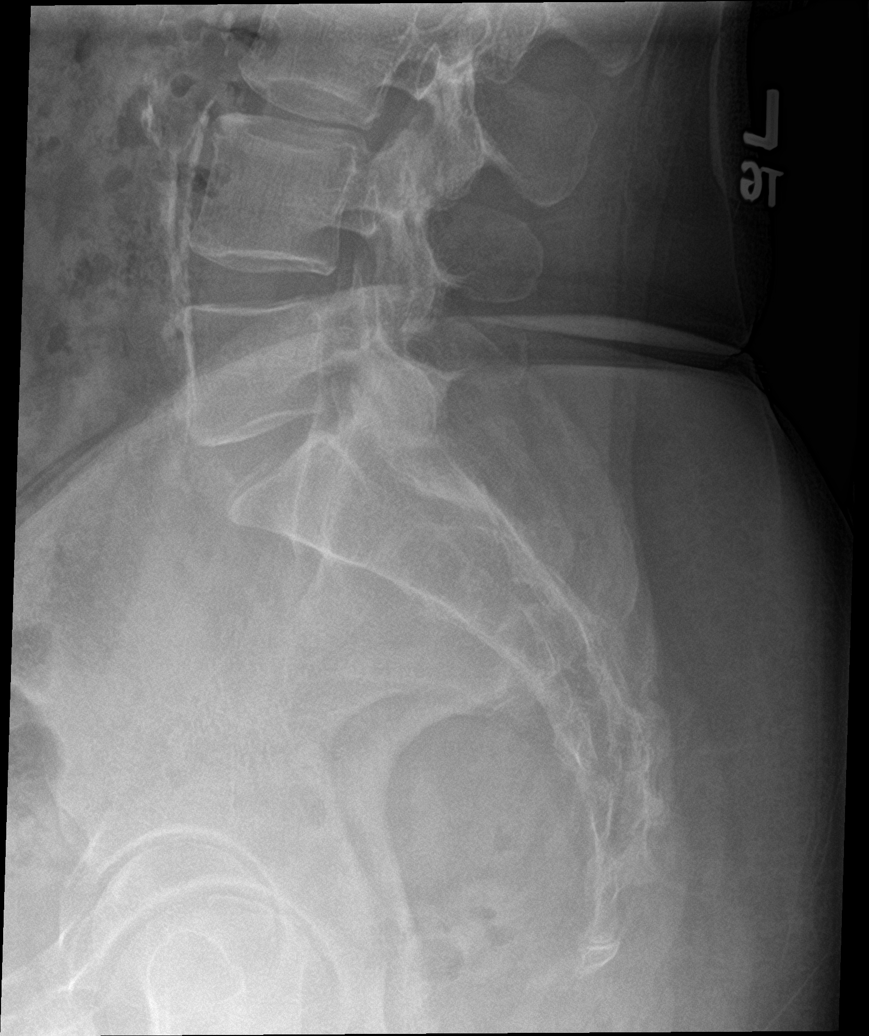

[5 of 5 positions shown; findings below may reference images not displayed]

FINDINGS: Normal alignment. Negative for fracture or pars defect. Mild disc
degeneration L2-3 and L3-4. Remaining disc spaces normal in height.
Atherosclerotic calcification.
IMPRESSION: Mild lumbar degenerative change.  No acute abnormality

Atherosclerotic aorta and iliacs.

## 2018-12-27 ENCOUNTER — Other Ambulatory Visit: Payer: Self-pay | Admitting: Family Medicine

## 2018-12-27 ENCOUNTER — Other Ambulatory Visit: Payer: Self-pay | Admitting: Sports Medicine

## 2018-12-27 DIAGNOSIS — M5416 Radiculopathy, lumbar region: Secondary | ICD-10-CM

## 2018-12-28 NOTE — Telephone Encounter (Signed)
Refaxed referral to [facility] if patient still does not receive appointment please let me know - CF

## 2019-01-14 ENCOUNTER — Other Ambulatory Visit (HOSPITAL_BASED_OUTPATIENT_CLINIC_OR_DEPARTMENT_OTHER): Payer: Self-pay | Admitting: Neurosurgery

## 2019-01-14 ENCOUNTER — Other Ambulatory Visit: Payer: Self-pay | Admitting: Neurosurgery

## 2019-01-14 DIAGNOSIS — G950 Syringomyelia and syringobulbia: Secondary | ICD-10-CM

## 2019-01-23 ENCOUNTER — Ambulatory Visit (INDEPENDENT_AMBULATORY_CARE_PROVIDER_SITE_OTHER): Payer: 59

## 2019-01-23 ENCOUNTER — Other Ambulatory Visit: Payer: Self-pay

## 2019-01-23 DIAGNOSIS — G950 Syringomyelia and syringobulbia: Secondary | ICD-10-CM

## 2019-02-07 ENCOUNTER — Other Ambulatory Visit: Payer: Self-pay | Admitting: Family Medicine

## 2019-02-16 ENCOUNTER — Other Ambulatory Visit: Payer: Self-pay

## 2019-02-16 ENCOUNTER — Encounter: Payer: Self-pay | Admitting: Family Medicine

## 2019-02-16 ENCOUNTER — Ambulatory Visit (INDEPENDENT_AMBULATORY_CARE_PROVIDER_SITE_OTHER): Payer: 59 | Admitting: Family Medicine

## 2019-02-16 VITALS — BP 135/62 | HR 73 | Ht 66.0 in | Wt 149.0 lb

## 2019-02-16 DIAGNOSIS — Z0000 Encounter for general adult medical examination without abnormal findings: Secondary | ICD-10-CM

## 2019-02-16 DIAGNOSIS — F411 Generalized anxiety disorder: Secondary | ICD-10-CM | POA: Diagnosis not present

## 2019-02-16 DIAGNOSIS — E348 Other specified endocrine disorders: Secondary | ICD-10-CM | POA: Diagnosis not present

## 2019-02-16 DIAGNOSIS — E785 Hyperlipidemia, unspecified: Secondary | ICD-10-CM

## 2019-02-16 DIAGNOSIS — K2100 Gastro-esophageal reflux disease with esophagitis, without bleeding: Secondary | ICD-10-CM

## 2019-02-16 DIAGNOSIS — Z23 Encounter for immunization: Secondary | ICD-10-CM | POA: Diagnosis not present

## 2019-02-16 DIAGNOSIS — Z8709 Personal history of other diseases of the respiratory system: Secondary | ICD-10-CM

## 2019-02-16 LAB — POCT GLYCOSYLATED HEMOGLOBIN (HGB A1C): Hemoglobin A1C: 5.8 % — AB (ref 4.0–5.6)

## 2019-02-16 MED ORDER — LORAZEPAM 0.5 MG PO TABS: ORAL_TABLET | ORAL | 0 refills | Status: DC

## 2019-02-16 MED ORDER — LOSARTAN POTASSIUM-HCTZ 50-12.5 MG PO TABS: 1.0000 | ORAL_TABLET | Freq: Every day | ORAL | 1 refills | Status: DC

## 2019-02-16 MED ORDER — PANTOPRAZOLE SODIUM 40 MG PO TBEC: 40.0000 mg | DELAYED_RELEASE_TABLET | Freq: Every day | ORAL | 4 refills | Status: DC

## 2019-02-16 MED ORDER — ALBUTEROL SULFATE HFA 108 (90 BASE) MCG/ACT IN AERS: INHALATION_SPRAY | RESPIRATORY_TRACT | 4 refills | Status: DC

## 2019-02-16 MED ORDER — ATORVASTATIN CALCIUM 40 MG PO TABS: 40.0000 mg | ORAL_TABLET | Freq: Every day | ORAL | 3 refills | Status: DC

## 2019-02-16 NOTE — Progress Notes (Signed)
Subjective:     Breanna Gomez is a 58 y.o. female and is here for a comprehensive physical exam. The patient reports no problems.  Flu shot is up-to-date.  She is okay with getting her tetanus vaccine.  Impaired fasting glucose-no increased thirst or urination. No symptoms consistent with hypoglycemia.   Social History   Socioeconomic History  . Marital status: Married    Spouse name: Not on file  . Number of children: 2  . Years of education: Not on file  . Highest education level: Not on file  Occupational History    Employer: [employer]  Social Needs  . Financial resource strain: Not on file  . Food insecurity    Worry: Not on file    Inability: Not on file  . Transportation needs    Medical: Not on file    Non-medical: Not on file  Tobacco Use  . Smoking status: Current Every Day Smoker    Packs/day: 0.50    Years: 40.00    Pack years: 20.00    Types: Cigarettes  . Smokeless tobacco: Never Used  Substance and Sexual Activity  . Alcohol use: No    Alcohol/week: 0.0 standard drinks  . Drug use: No  . Sexual activity: Yes    Partners: Male  Lifestyle  . Physical activity    Days per week: Not on file    Minutes per session: Not on file  . Stress: Not on file  Relationships  . Social Herbalist on phone: Not on file    Gets together: Not on file    Attends religious service: Not on file    Active member of club or organization: Not on file    Attends meetings of clubs or organizations: Not on file    Relationship status: Not on file  . Intimate partner violence    Fear of current or ex partner: Not on file    Emotionally abused: Not on file    Physically abused: Not on file    Forced sexual activity: Not on file  Other Topics Concern  . Not on file  Social History Narrative   No regular exercise.  Daily caffeine intake.    Health Maintenance  Topic Date Due  . MAMMOGRAM  03/05/2020  . COLONOSCOPY  11/22/2020  . PAP SMEAR-Modifier   02/04/2022  . TETANUS/TDAP  02/15/2029  . INFLUENZA VACCINE  Completed  . Hepatitis C Screening  Completed  . HIV Screening  Completed    The following portions of the patient's history were reviewed and updated as appropriate: allergies, current medications, past family history, past medical history, past social history, past surgical history and problem list.  Review of Systems A comprehensive review of systems was negative.   Objective:    BP 135/62   Pulse 73   Ht 5\' 6"  (1.676 m)   Wt 149 lb (67.6 kg)   LMP 05/12/2000   SpO2 100%   BMI 24.05 kg/m  General appearance: alert, cooperative and appears stated age Head: Normocephalic, without obvious abnormality, atraumatic Eyes: conj clear, EOMI, PEERLA Ears: normal TM's and external ear canals both ears Nose: Nares normal. Septum midline. Mucosa normal. No drainage or sinus tenderness. Throat: lips, mucosa, and tongue normal; teeth and gums normal Neck: no adenopathy, no carotid bruit, no JVD, supple, symmetrical, trachea midline and thyroid not enlarged, symmetric, no tenderness/mass/nodules Back: symmetric, no curvature. ROM normal. No CVA tenderness. Lungs: clear to auscultation bilaterally Breasts: not  examined Heart: regular rate and rhythm, S1, S2 normal, no murmur, click, rub or gallop Abdomen: soft, non-tender; bowel sounds normal; no masses,  no organomegaly Extremities: extremities normal, atraumatic, no cyanosis or edema Pulses: 2+ and symmetric Skin: Skin color, texture, turgor normal. No rashes or lesions Lymph nodes: Cervical, supraclavicular, and axillary nodes normal. Neurologic: Alert and oriented X 3, normal strength and tone. Normal symmetric reflexes. Normal coordination and gait    Assessment:    Healthy female exam.      Plan:     See After Visit Summary for Counseling Recommendations   Keep up a regular exercise program and make sure you are eating a healthy diet Try to eat 4 servings of dairy a  day, or if you are lactose intolerant take a calcium with vitamin D daily.  Your vaccines are up to date.   Adult vaccines due  Topic Date Due  . TETANUS/TDAP  02/15/2029

## 2019-02-16 NOTE — Patient Instructions (Signed)

## 2019-03-08 LAB — HM MAMMOGRAPHY

## 2019-03-24 ENCOUNTER — Encounter: Payer: Self-pay | Admitting: Family Medicine

## 2019-04-16 ENCOUNTER — Other Ambulatory Visit: Payer: Self-pay | Admitting: Sports Medicine

## 2019-04-16 DIAGNOSIS — M5416 Radiculopathy, lumbar region: Secondary | ICD-10-CM

## 2019-05-06 ENCOUNTER — Other Ambulatory Visit: Payer: Self-pay | Admitting: Family Medicine

## 2019-07-08 ENCOUNTER — Other Ambulatory Visit: Payer: Self-pay | Admitting: Family Medicine

## 2019-07-08 DIAGNOSIS — F411 Generalized anxiety disorder: Secondary | ICD-10-CM

## 2019-09-13 ENCOUNTER — Other Ambulatory Visit: Payer: Self-pay | Admitting: Family Medicine

## 2019-09-13 DIAGNOSIS — K2100 Gastro-esophageal reflux disease with esophagitis, without bleeding: Secondary | ICD-10-CM

## 2019-09-13 DIAGNOSIS — E785 Hyperlipidemia, unspecified: Secondary | ICD-10-CM

## 2019-09-13 DIAGNOSIS — F411 Generalized anxiety disorder: Secondary | ICD-10-CM

## 2019-09-16 ENCOUNTER — Telehealth: Payer: Self-pay | Admitting: *Deleted

## 2019-09-16 MED ORDER — LEVALBUTEROL TARTRATE 45 MCG/ACT IN AERO: 2.0000 | INHALATION_SPRAY | Freq: Four times a day (QID) | RESPIRATORY_TRACT | 6 refills | Status: DC | PRN

## 2019-09-16 NOTE — Telephone Encounter (Signed)
Fax received from [facility] pharmacy stating that a covered alternative for Ventolin HFA 108 (90 Base) is Xopenex HFA.   Will switch to Xopenex HFA.

## 2019-09-22 ENCOUNTER — Ambulatory Visit: Payer: 59 | Admitting: Family Medicine

## 2019-09-27 ENCOUNTER — Ambulatory Visit: Payer: 59 | Admitting: Family Medicine

## 2019-10-05 ENCOUNTER — Ambulatory Visit: Payer: 59 | Admitting: Family Medicine

## 2019-10-05 ENCOUNTER — Encounter: Payer: Self-pay | Admitting: Family Medicine

## 2019-10-05 VITALS — BP 128/67 | HR 78 | Ht 66.0 in | Wt 150.0 lb

## 2019-10-05 DIAGNOSIS — F411 Generalized anxiety disorder: Secondary | ICD-10-CM

## 2019-10-05 DIAGNOSIS — E785 Hyperlipidemia, unspecified: Secondary | ICD-10-CM

## 2019-10-05 DIAGNOSIS — F430 Acute stress reaction: Secondary | ICD-10-CM | POA: Diagnosis not present

## 2019-10-05 DIAGNOSIS — I10 Essential (primary) hypertension: Secondary | ICD-10-CM | POA: Diagnosis not present

## 2019-10-05 DIAGNOSIS — R7301 Impaired fasting glucose: Secondary | ICD-10-CM

## 2019-10-05 LAB — COMPLETE METABOLIC PANEL WITH GFR
AG Ratio: 2 (calc) (ref 1.0–2.5)
ALT: 11 U/L (ref 6–29)
AST: 13 U/L (ref 10–35)
Albumin: 4.3 g/dL (ref 3.6–5.1)
Alkaline phosphatase (APISO): 69 U/L (ref 37–153)
BUN: 18 mg/dL (ref 7–25)
CO2: 29 mmol/L (ref 20–32)
Calcium: 9.8 mg/dL (ref 8.6–10.4)
Chloride: 103 mmol/L (ref 98–110)
Creat: 0.8 mg/dL (ref 0.50–0.99)
GFR, Est African American: 93 mL/min/{1.73_m2} (ref >=60)
GFR, Est Non African American: 80 mL/min/{1.73_m2} (ref >=60)
Globulin: 2.1 g/dL (calc) (ref 1.9–3.7)
Glucose, Bld: 111 mg/dL — ABNORMAL HIGH (ref 65–99)
Potassium: 5 mmol/L (ref 3.5–5.3)
Sodium: 136 mmol/L (ref 135–146)
Total Bilirubin: 0.6 mg/dL (ref 0.2–1.2)
Total Protein: 6.4 g/dL (ref 6.1–8.1)

## 2019-10-05 LAB — LIPID PANEL
Cholesterol: 152 mg/dL (ref <200)
HDL: 54 mg/dL (ref >=50)
LDL Cholesterol (Calc): 78 mg/dL (calc)
Non-HDL Cholesterol (Calc): 98 mg/dL (calc) (ref <130)
Total CHOL/HDL Ratio: 2.8 (calc) (ref <5.0)
Triglycerides: 112 mg/dL (ref <150)

## 2019-10-05 LAB — CBC
HCT: 44.9 % (ref 35.0–45.0)
Hemoglobin: 15.1 g/dL (ref 11.7–15.5)
MCH: 30.4 pg (ref 27.0–33.0)
MCHC: 33.6 g/dL (ref 32.0–36.0)
MCV: 90.5 fL (ref 80.0–100.0)
MPV: 9.3 fL (ref 7.5–12.5)
Platelets: 362 10*3/uL (ref 140–400)
RBC: 4.96 10*6/uL (ref 3.80–5.10)
RDW: 12.2 % (ref 11.0–15.0)
WBC: 9 10*3/uL (ref 3.8–10.8)

## 2019-10-05 LAB — POCT GLYCOSYLATED HEMOGLOBIN (HGB A1C): Hemoglobin A1C: 5.8 % — AB (ref 4.0–5.6)

## 2019-10-05 NOTE — Assessment & Plan Note (Signed)
Due to recheck lipids. 

## 2019-10-05 NOTE — Assessment & Plan Note (Signed)
A1c looks great today.  Continue to work on healthy diet and regular exercise.

## 2019-10-05 NOTE — Assessment & Plan Note (Signed)
Well controlled. Continue current regimen. Follow up in  6 mo  

## 2019-10-05 NOTE — Progress Notes (Signed)
Established Patient Office Visit  Subjective:  Patient ID: Breanna Gomez, female    DOB: [birth year]  Age: 59 y.o. MRN: [id number]  CC:  Chief Complaint  Patient presents with  . Hypertension  . ifg    HPI Breanna Gomez presents for    Hypertension- Pt denies chest pain, SOB, dizziness, or heart palpitations.  Taking meds as directed w/o problems.  Denies medication side effects.    Impaired fasting glucose-no increased thirst or urination. No symptoms consistent with hypoglycemia.  She is struggling with making good food choices she just feels hungry all the time and never feels satiated.  She feels like her anxiety levels have been increased recently unfortunately her husband was hospitalized and ended up with a amputation.  That required significant recovery and taking him to visits etc.  She says just been really hard to focus at work.  She does have a history of adult ADHD and was on medication years ago with Dr. Valetta Close until it started increasing her blood pressure.  Said there was a short period of time where she was actually taking her lorazepam almost daily but says she is back on track now with using it less frequently.   Past Medical History:  Diagnosis Date  . Goiter, euthyroid     Past Surgical History:  Procedure Laterality Date  . BREAST SURGERY     cyst removes from r breast  . removed cyst from tailbone     removed cyst   . TONSILLECTOMY  59 yrs old    Family History  Problem Relation Age of Onset  . Hypertension Mother   . Hyperlipidemia Mother   . Alzheimer's disease Mother   . Hyperlipidemia Father   . Hypertension Father   . Diabetes Father   . Hyperlipidemia Sister   . Hypertension Sister   . Diabetes Sister     Social History   Socioeconomic History  . Marital status: Married    Spouse name: Not on file  . Number of children: 2  . Years of education: Not on file  . Highest education level: Not on file  Occupational History     Employer: [employer]  Tobacco Use  . Smoking status: Current Every Day Smoker    Packs/day: 0.50    Years: 40.00    Pack years: 20.00    Types: Cigarettes  . Smokeless tobacco: Never Used  Substance and Sexual Activity  . Alcohol use: No    Alcohol/week: 0.0 standard drinks  . Drug use: No  . Sexual activity: Yes    Partners: Male  Other Topics Concern  . Not on file  Social History Narrative   No regular exercise.  Daily caffeine intake.    Social Determinants of Health   Financial Resource Strain:   . Difficulty of Paying Living Expenses:   Food Insecurity:   . Worried About Charity fundraiser in the Last Year:   . Arboriculturist in the Last Year:   Transportation Needs:   . Film/video editor (Medical):   Marland Kitchen Lack of Transportation (Non-Medical):   Physical Activity:   . Days of Exercise per Week:   . Minutes of Exercise per Session:   Stress:   . Feeling of Stress :   Social Connections:   . Frequency of Communication with Friends and Family:   . Frequency of Social Gatherings with Friends and Family:   . Attends Religious Services:   . Active Member  of Clubs or Organizations:   . Attends Banker Meetings:   Marland Kitchen Marital Status:   Intimate Partner Violence:   . Fear of Current or Ex-Partner:   . Emotionally Abused:   Marland Kitchen Physically Abused:   . Sexually Abused:     Outpatient Medications Prior to Visit  Medication Sig Dispense Refill  . Ascorbic Acid (VITAMIN C) 1000 MG tablet Take 1,000 mg by mouth daily.    Marland Kitchen atorvastatin (LIPITOR) 40 MG tablet Take 1 tablet (40 mg total) by mouth daily. 90 tablet 3  . celecoxib (CELEBREX) 200 MG capsule TAKE ONE OR TWO CAPSULES BY MOUTH DAILY AS NEEDED FOR PAIN 180 capsule 2  . Cholecalciferol (VITAMIN D3 PO) Take by mouth.    . citalopram (CELEXA) 20 MG tablet Take 1 tablet (20 mg total) by mouth daily. 90 tablet 1  . levalbuterol (XOPENEX HFA) 45 MCG/ACT inhaler Inhale 2 puffs into the lungs every 6 (six)  hours as needed for wheezing. 15 g 6  . LORazepam (ATIVAN) 0.5 MG tablet TAKE ONE TABLET BY MOUTH TWICE DAILY AS NEEDED FOR ANXIETY 60 tablet 0  . losartan-hydrochlorothiazide (HYZAAR) 50-12.5 MG tablet TAKE ONE TABLET BY MOUTH EVERY DAY 90 tablet 1  . Melatonin 1 MG TABS Take 1 tablet by mouth at bedtime as needed.    . pantoprazole (PROTONIX) 40 MG tablet Take 1 tablet (40 mg total) by mouth daily. 90 tablet 4  . Black Elderberry 50 MG/5ML SYRP Take by mouth 2 (two) times daily.     No facility-administered medications prior to visit.    Allergies  Allergen Reactions  . Nsaids Other (See Comments)    GI ulcers.  GI ulcers.     ROS Review of Systems    Objective:    Physical Exam  Constitutional: She is oriented to person, place, and time. She appears well-developed and well-nourished.  HENT:  Head: Normocephalic and atraumatic.  Cardiovascular: Normal rate, regular rhythm and normal heart sounds.  Pulmonary/Chest: Effort normal and breath sounds normal.  Neurological: She is alert and oriented to person, place, and time.  Skin: Skin is warm and dry.  Psychiatric: She has a normal mood and affect. Her behavior is normal.    BP 128/67 (BP Location: Left Arm, Cuff Size: Normal)   Pulse 78   Ht 5\' 6"  (1.676 m)   Wt 150 lb (68 kg)   LMP 05/12/2000   SpO2 100%   BMI 24.21 kg/m  Wt Readings from Last 3 Encounters:  10/05/19 150 lb (68 kg)  02/16/19 149 lb (67.6 kg)  11/05/18 148 lb (67.1 kg)     There are no preventive care reminders to display for this patient.  There are no preventive care reminders to display for this patient.  Lab Results  Component Value Date   TSH 1.28 02/10/2018   Lab Results  Component Value Date   WBC 10.3 01/07/2016   HGB 15.1 01/07/2016   HCT 44.4 01/07/2016   MCV 89.7 01/07/2016   PLT 355 01/07/2016   Lab Results  Component Value Date   NA 135 11/05/2018   K 4.1 11/05/2018   CO2 29 11/05/2018   GLUCOSE 100 (H) 11/05/2018    BUN 17 11/05/2018   CREATININE 0.71 11/05/2018   BILITOT 0.7 11/05/2018   ALKPHOS 59 01/07/2016   AST 18 11/05/2018   ALT 20 11/05/2018   PROT 6.6 11/05/2018   ALBUMIN 4.3 01/07/2016   CALCIUM 10.5 (H) 11/05/2018   Lab  Results  Component Value Date   CHOL 156 11/05/2018   Lab Results  Component Value Date   HDL 58 11/05/2018   Lab Results  Component Value Date   LDLCALC 74 11/05/2018   Lab Results  Component Value Date   TRIG 162 (H) 11/05/2018   Lab Results  Component Value Date   CHOLHDL 2.7 11/05/2018   Lab Results  Component Value Date   HGBA1C 5.8 (A) 10/05/2019      Assessment & Plan:   Problem List Items Addressed This Visit      Cardiovascular and Mediastinum   HYPERTENSION, BENIGN ESSENTIAL    Well controlled. Continue current regimen. Follow up in  6 mo      Relevant Orders   POCT glycosylated hemoglobin (Hb A1C) (Completed)   Lipid panel   COMPLETE METABOLIC PANEL WITH GFR   CBC     Endocrine   INSULIN RESISTANCE SYNDROME - Primary    A1c looks great today.  Continue to work on healthy diet and regular exercise.        Other   Hyperlipidemia    Due to recheck lipids.        Relevant Orders   POCT glycosylated hemoglobin (Hb A1C) (Completed)   Lipid panel   COMPLETE METABOLIC PANEL WITH GFR   CBC   ANXIETY DISORDER, GENERALIZED    she does feel like things are getting a little bit better but still feeling overwhelmed at times.  We discussed maybe even considering treating her ADHD again at some point sounds like she had elevated blood pressures on Adderall and Vyvanse in the past she has also tried methylphenidate.  She might actually be a great candidate for a nonstimulant medications I did encourage her to think about this.  Also just reminded her to use the lorazepam sparingly we also talked about that at some point we may need to switch lorazepam to clonazepam or alprazolam which is considered safer in the outpatient setting.        Other Visit Diagnoses    Acute reaction to stress          Reaction to stress-she does feel like things are getting a little bit better but still feeling overwhelmed at times.  We discussed maybe even considering treating her ADHD again at some point sounds like she had elevated blood pressures on Adderall and Vyvanse in the past she has also tried methylphenidate.  She might actually be a great candidate for a nonstimulant medications I did encourage her to think about this.  Also just reminded her to use the lorazepam sparingly we also talked about that at some point we may need to switch lorazepam to clonazepam or alprazolam which is considered safer in the outpatient setting.  No orders of the defined types were placed in this encounter.   Follow-up: Return in about 6 months (around 04/06/2020) for Hypertension.    Nani Gasser, MD

## 2019-10-05 NOTE — Assessment & Plan Note (Signed)
she does feel like things are getting a little bit better but still feeling overwhelmed at times.  We discussed maybe even considering treating her ADHD again at some point sounds like she had elevated blood pressures on Adderall and Vyvanse in the past she has also tried methylphenidate.  She might actually be a great candidate for a nonstimulant medications I did encourage her to think about this.  Also just reminded her to use the lorazepam sparingly we also talked about that at some point we may need to switch lorazepam to clonazepam or alprazolam which is considered safer in the outpatient setting.

## 2019-10-07 NOTE — Progress Notes (Signed)
All labs are normal. 

## 2020-01-03 ENCOUNTER — Other Ambulatory Visit: Payer: Self-pay | Admitting: Family Medicine

## 2020-01-03 DIAGNOSIS — F411 Generalized anxiety disorder: Secondary | ICD-10-CM

## 2020-01-03 NOTE — Telephone Encounter (Signed)
Call patient and let her know that I went ahead and switched her alprazolam to clonazepam.  Still in the same family but a little bit more safe long-term and we have briefly talked about this in the past and switching her to something safer especially for more long-term use.  Still same number of tabs 60.  New prescription sent to [facility] pharmacy.

## 2020-01-03 NOTE — Telephone Encounter (Signed)
Spoke w/pt and she voiced understanding. She is concerned about making this switch right now due to her circumstances with her husbands health and knowing how she functions on this current medicine. She stated that her concern is it causing her to become drowsy when she is driving or helping him during a crisis. She stated that her daughter takes this medicine and it causes her to become very drowsy.   She again wanted Dr. Linford Arnold to know that she is ok with this switch but also wanted her to understand her concerns about the switch at this time.

## 2020-02-27 ENCOUNTER — Other Ambulatory Visit: Payer: Self-pay | Admitting: Sports Medicine

## 2020-02-27 DIAGNOSIS — M5416 Radiculopathy, lumbar region: Secondary | ICD-10-CM

## 2020-03-10 ENCOUNTER — Other Ambulatory Visit: Payer: Self-pay | Admitting: Sports Medicine

## 2020-03-10 DIAGNOSIS — M5416 Radiculopathy, lumbar region: Secondary | ICD-10-CM

## 2020-04-02 LAB — HM MAMMOGRAPHY

## 2020-04-10 ENCOUNTER — Other Ambulatory Visit: Payer: Self-pay

## 2020-04-10 ENCOUNTER — Encounter: Payer: Self-pay | Admitting: Family Medicine

## 2020-04-10 ENCOUNTER — Ambulatory Visit (INDEPENDENT_AMBULATORY_CARE_PROVIDER_SITE_OTHER): Payer: 59 | Admitting: Family Medicine

## 2020-04-10 VITALS — BP 127/55 | HR 74 | Ht 66.0 in | Wt 152.0 lb

## 2020-04-10 DIAGNOSIS — Z23 Encounter for immunization: Secondary | ICD-10-CM | POA: Diagnosis not present

## 2020-04-10 DIAGNOSIS — F411 Generalized anxiety disorder: Secondary | ICD-10-CM | POA: Diagnosis not present

## 2020-04-10 DIAGNOSIS — F4321 Adjustment disorder with depressed mood: Secondary | ICD-10-CM | POA: Diagnosis not present

## 2020-04-10 DIAGNOSIS — Z0000 Encounter for general adult medical examination without abnormal findings: Secondary | ICD-10-CM

## 2020-04-10 MED ORDER — DIAZEPAM 2 MG PO TABS: 2.0000 mg | ORAL_TABLET | Freq: Every day | ORAL | 0 refills | Status: DC | PRN

## 2020-04-10 NOTE — Progress Notes (Addendum)
Subjective:     Breanna Gomez is a 59 y.o. female and is here for a comprehensive physical exam. The patient reports problems - husband passed away last month.  Abs are up-to-date and were performed in June.  Colonoscopy was in 2017.  Mammogram performed last year in October.  Tdap is up-to-date.  Social History   Socioeconomic History  . Marital status: Married    Spouse name: Not on file  . Number of children: 2  . Years of education: Not on file  . Highest education level: Not on file  Occupational History    Employer: [employer]  Tobacco Use  . Smoking status: Current Every Day Smoker    Packs/day: 0.50    Years: 40.00    Pack years: 20.00    Types: Cigarettes  . Smokeless tobacco: Never Used  Substance and Sexual Activity  . Alcohol use: No    Alcohol/week: 0.0 standard drinks  . Drug use: No  . Sexual activity: Yes    Partners: Male  Other Topics Concern  . Not on file  Social History Narrative   No regular exercise.  Daily caffeine intake.    Social Determinants of Health   Financial Resource Strain:   . Difficulty of Paying Living Expenses: Not on file  Food Insecurity:   . Worried About Programme researcher, broadcasting/film/video in the Last Year: Not on file  . Ran Out of Food in the Last Year: Not on file  Transportation Needs:   . Lack of Transportation (Medical): Not on file  . Lack of Transportation (Non-Medical): Not on file  Physical Activity:   . Days of Exercise per Week: Not on file  . Minutes of Exercise per Session: Not on file  Stress:   . Feeling of Stress : Not on file  Social Connections:   . Frequency of Communication with Friends and Family: Not on file  . Frequency of Social Gatherings with Friends and Family: Not on file  . Attends Religious Services: Not on file  . Active Member of Clubs or Organizations: Not on file  . Attends Banker Meetings: Not on file  . Marital Status: Not on file  Intimate Partner Violence:   . Fear of Current  or Ex-Partner: Not on file  . Emotionally Abused: Not on file  . Physically Abused: Not on file  . Sexually Abused: Not on file   Health Maintenance  Topic Date Due  . COLONOSCOPY  11/22/2020  . PAP SMEAR-Modifier  02/04/2022  . MAMMOGRAM  04/02/2022  . TETANUS/TDAP  02/15/2029  . INFLUENZA VACCINE  Completed  . COVID-19 Vaccine  Completed  . Hepatitis C Screening  Completed  . HIV Screening  Completed    The following portions of the patient's history were reviewed and updated as appropriate: allergies, current medications, past family history, past medical history, past social history, past surgical history and problem list.  Review of Systems A comprehensive review of systems was negative.   Objective:    BP (!) 127/55   Pulse 74   Ht 5\' 6"  (1.676 m)   Wt 152 lb (68.9 kg)   LMP 05/12/2000   SpO2 96%   BMI 24.53 kg/m  General appearance: alert, cooperative and appears stated age Head: Normocephalic, without obvious abnormality, atraumatic Eyes: conj clear. EOMi, PEERLA Ears: normal TM's and external ear canals both ears Nose: Nares normal. Septum midline. Mucosa normal. No drainage or sinus tenderness. Throat: lips, mucosa, and tongue normal;  teeth and gums normal Neck: no adenopathy, no carotid bruit, no JVD, supple, symmetrical, trachea midline and thyroid not enlarged, symmetric, no tenderness/mass/nodules Back: symmetric, no curvature. ROM normal. No CVA tenderness. Lungs: clear to auscultation bilaterally Breasts: normal appearance, no masses or tenderness Heart: regular rate and rhythm, S1, S2 normal, no murmur, click, rub or gallop Abdomen: soft, non-tender; bowel sounds normal; no masses,  no organomegaly Pelvic: deferred Extremities: extremities normal, atraumatic, no cyanosis or edema Pulses: 2+ and symmetric Skin: Skin color, texture, turgor normal. No rashes or lesions Lymph nodes: Cervical adenopathy: nl and Supraclavicular adenopathy: nl Neurologic:  Alert and oriented X 3, normal strength and tone. Normal symmetric reflexes. Normal coordination and gait    Assessment:    Healthy female exam.  Keep up a regular exercise program and make sure you are eating a healthy diet Try to eat 4 servings of dairy a day, or if you are lactose intolerant take a calcium with vitamin D daily.  Your vaccines are up to date.       Plan:     See After Visit Summary for Counseling Recommendations   Keep up a regular exercise program and make sure you are eating a healthy diet Try to eat 4 servings of dairy a day, or if you are lactose intolerant take a calcium with vitamin D daily.  Your vaccines are up to date.  Due for BMP and hemoglobin A1c today. Pressure looks fantastic.   Grief - offered to referral for counseling, since the loss of her husband..  Discussed ok to cut the citalopram ion half and she feels very flat and feels like she can't cry.  She does have supportive friends and family.  Encouraged her to please reach out if at any point she feels like we can be helpful.  She is not having any thoughts of wanting to harm herself.  It also recently switched her from Ativan to clonazepam for safety reasons.  She says the clonazepam actually makes her feel really sleepy and sedated even when she took a half a pill it makes her want to sleep.  She would prefer to go back to Ativan but again I discussed with her that we don't typically use this in the outpatient setting we could consider a low-dose of diazepam.  She still has a little bit of the lorazepam leftover.

## 2020-04-10 NOTE — Patient Instructions (Signed)
Health Maintenance, Female Adopting a healthy lifestyle and getting preventive care are important in promoting health and wellness. Ask your health care provider about:  The right schedule for you to have regular tests and exams.  Things you can do on your own to prevent diseases and keep yourself healthy. What should I know about diet, weight, and exercise? Eat a healthy diet   Eat a diet that includes plenty of vegetables, fruits, low-fat dairy products, and lean protein.  Do not eat a lot of foods that are high in solid fats, added sugars, or sodium. Maintain a healthy weight Body mass index (BMI) is used to identify weight problems. It estimates body fat based on height and weight. Your health care provider can help determine your BMI and help you achieve or maintain a healthy weight. Get regular exercise Get regular exercise. This is one of the most important things you can do for your health. Most adults should:  Exercise for at least 150 minutes each week. The exercise should increase your heart rate and make you sweat (moderate-intensity exercise).  Do strengthening exercises at least twice a week. This is in addition to the moderate-intensity exercise.  Spend less time sitting. Even light physical activity can be beneficial. Watch cholesterol and blood lipids Have your blood tested for lipids and cholesterol at 61 years of age, then have this test every 5 years. Have your cholesterol levels checked more often if:  Your lipid or cholesterol levels are high.  You are older than 61 years of age.  You are at high risk for heart disease. What should I know about cancer screening? Depending on your health history and family history, you may need to have cancer screening at various ages. This may include screening for:  Breast cancer.  Cervical cancer.  Colorectal cancer.  Skin cancer.  Lung cancer. What should I know about heart disease, diabetes, and high blood  pressure? Blood pressure and heart disease  High blood pressure causes heart disease and increases the risk of stroke. This is more likely to develop in people who have high blood pressure readings, are of African descent, or are overweight.  Have your blood pressure checked: ? Every 3-5 years if you are 18-39 years of age. ? Every year if you are 40 years old or older. Diabetes Have regular diabetes screenings. This checks your fasting blood sugar level. Have the screening done:  Once every three years after age 40 if you are at a normal weight and have a low risk for diabetes.  More often and at a younger age if you are overweight or have a high risk for diabetes. What should I know about preventing infection? Hepatitis B If you have a higher risk for hepatitis B, you should be screened for this virus. Talk with your health care provider to find out if you are at risk for hepatitis B infection. Hepatitis C Testing is recommended for:  Everyone born from 1945 through 1965.  Anyone with known risk factors for hepatitis C. Sexually transmitted infections (STIs)  Get screened for STIs, including gonorrhea and chlamydia, if: ? You are sexually active and are younger than 61 years of age. ? You are older than 61 years of age and your health care provider tells you that you are at risk for this type of infection. ? Your sexual activity has changed since you were last screened, and you are at increased risk for chlamydia or gonorrhea. Ask your health care provider if   you are at risk.  Ask your health care provider about whether you are at high risk for HIV. Your health care provider may recommend a prescription medicine to help prevent HIV infection. If you choose to take medicine to prevent HIV, you should first get tested for HIV. You should then be tested every 3 months for as long as you are taking the medicine. Pregnancy  If you are about to stop having your period (premenopausal) and  you may become pregnant, seek counseling before you get pregnant.  Take 400 to 800 micrograms (mcg) of folic acid every day if you become pregnant.  Ask for birth control (contraception) if you want to prevent pregnancy. Osteoporosis and menopause Osteoporosis is a disease in which the bones lose minerals and strength with aging. This can result in bone fractures. If you are 65 years old or older, or if you are at risk for osteoporosis and fractures, ask your health care provider if you should:  Be screened for bone loss.  Take a calcium or vitamin D supplement to lower your risk of fractures.  Be given hormone replacement therapy (HRT) to treat symptoms of menopause. Follow these instructions at home: Lifestyle  Do not use any products that contain nicotine or tobacco, such as cigarettes, e-cigarettes, and chewing tobacco. If you need help quitting, ask your health care provider.  Do not use street drugs.  Do not share needles.  Ask your health care provider for help if you need support or information about quitting drugs. Alcohol use  Do not drink alcohol if: ? Your health care provider tells you not to drink. ? You are pregnant, may be pregnant, or are planning to become pregnant.  If you drink alcohol: ? Limit how much you use to 0-1 drink a day. ? Limit intake if you are breastfeeding.  Be aware of how much alcohol is in your drink. In the [place] one drink equals one 12 oz bottle of beer (355 mL), one 5 oz glass of wine (148 mL), or one 1 oz glass of hard liquor (44 mL). General instructions  Schedule regular health, dental, and eye exams.  Stay current with your vaccines.  Tell your health care provider if: ? You often feel depressed. ? You have ever been abused or do not feel safe at home. Summary  Adopting a healthy lifestyle and getting preventive care are important in promoting health and wellness.  Follow your health care provider's instructions about healthy  diet, exercising, and getting tested or screened for diseases.  Follow your health care provider's instructions on monitoring your cholesterol and blood pressure. This information is not intended to replace advice given to you by your health care provider. Make sure you discuss any questions you have with your health care provider. Document Revised: 04/21/2018 Document Reviewed: 04/21/2018 Elsevier Patient Education  2020 Elsevier Inc.  

## 2020-04-10 NOTE — Addendum Note (Signed)
Addended by: Nani Gasser D on: 04/10/2020 10:16 AM   Modules accepted: Orders

## 2020-04-11 LAB — BASIC METABOLIC PANEL WITH GFR
BUN: 13 mg/dL (ref 7–25)
CO2: 32 mmol/L (ref 20–32)
Calcium: 10.6 mg/dL — ABNORMAL HIGH (ref 8.6–10.4)
Chloride: 101 mmol/L (ref 98–110)
Creat: 0.78 mg/dL (ref 0.50–0.99)
GFR, Est African American: 95 mL/min/{1.73_m2} (ref >=60)
GFR, Est Non African American: 82 mL/min/{1.73_m2} (ref >=60)
Glucose, Bld: 101 mg/dL — ABNORMAL HIGH (ref 65–99)
Potassium: 5.1 mmol/L (ref 3.5–5.3)
Sodium: 137 mmol/L (ref 135–146)

## 2020-04-11 LAB — HEMOGLOBIN A1C
Hgb A1c MFr Bld: 5.6 % of total Hgb (ref <5.7)
Mean Plasma Glucose: 114 (calc)
eAG (mmol/L): 6.3 (calc)

## 2020-07-02 ENCOUNTER — Encounter: Payer: Self-pay | Admitting: Family Medicine

## 2020-07-02 NOTE — Telephone Encounter (Signed)
Ok for letter?    Thank you

## 2020-07-03 ENCOUNTER — Encounter: Payer: Self-pay | Admitting: Family Medicine

## 2020-07-18 ENCOUNTER — Other Ambulatory Visit: Payer: Self-pay | Admitting: Family Medicine

## 2020-08-29 ENCOUNTER — Other Ambulatory Visit: Payer: Self-pay | Admitting: Family Medicine

## 2020-09-14 ENCOUNTER — Other Ambulatory Visit: Payer: Self-pay | Admitting: Family Medicine

## 2020-09-14 DIAGNOSIS — K2100 Gastro-esophageal reflux disease with esophagitis, without bleeding: Secondary | ICD-10-CM

## 2020-10-09 ENCOUNTER — Ambulatory Visit: Payer: 59 | Admitting: Family Medicine

## 2020-10-09 ENCOUNTER — Encounter: Payer: Self-pay | Admitting: Family Medicine

## 2020-10-09 ENCOUNTER — Telehealth: Payer: Self-pay | Admitting: Family Medicine

## 2020-10-09 ENCOUNTER — Other Ambulatory Visit: Payer: Self-pay

## 2020-10-09 VITALS — BP 142/80 | HR 71 | Ht 66.0 in | Wt 150.0 lb

## 2020-10-09 DIAGNOSIS — I10 Essential (primary) hypertension: Secondary | ICD-10-CM | POA: Diagnosis not present

## 2020-10-09 DIAGNOSIS — E785 Hyperlipidemia, unspecified: Secondary | ICD-10-CM

## 2020-10-09 DIAGNOSIS — E348 Other specified endocrine disorders: Secondary | ICD-10-CM | POA: Diagnosis not present

## 2020-10-09 DIAGNOSIS — F17200 Nicotine dependence, unspecified, uncomplicated: Secondary | ICD-10-CM

## 2020-10-09 DIAGNOSIS — F411 Generalized anxiety disorder: Secondary | ICD-10-CM

## 2020-10-09 DIAGNOSIS — F4321 Adjustment disorder with depressed mood: Secondary | ICD-10-CM

## 2020-10-09 LAB — POCT GLYCOSYLATED HEMOGLOBIN (HGB A1C): Hemoglobin A1C: 5.6 % (ref 4.0–5.6)

## 2020-10-09 MED ORDER — LOSARTAN POTASSIUM-HCTZ 100-12.5 MG PO TABS: 1.0000 | ORAL_TABLET | Freq: Every day | ORAL | 1 refills | Status: DC

## 2020-10-09 NOTE — Telephone Encounter (Signed)
Please call patient and let her know that I am going to adjust her blood pressure pill just slightly so it is gone to be the 100/12.5 instead of the 50/12.5.  Encouraged her to follow-up in 2 to 3 weeks for nurse blood pressure check.

## 2020-10-09 NOTE — Assessment & Plan Note (Signed)
We discussed options she just wants to stay with the citalopram lower dose for now.  She says overall she feels like it still really effective for her.  GAD-7 score of 16 and PHQ-9 score of 9.  Rates symptoms as somewhat difficult.  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  Work is sort of kept her engaged which is good.

## 2020-10-09 NOTE — Assessment & Plan Note (Signed)
A1c looks great today at 5.6 even though she reports that her diet has not been great since her husband passed she has not been really cooking on her own and has just been grabbing more quick foods.

## 2020-10-09 NOTE — Assessment & Plan Note (Signed)
Smoking has ramped up since working from home.  Discussed setting goals and working on decreasing smoking.

## 2020-10-09 NOTE — Assessment & Plan Note (Addendum)
BP borderline today.will recheck before she leaves.   Plan to increase losartan to 100 mg.  Follow-up in 2 weeks for nurse visit.  Due for labs.

## 2020-10-09 NOTE — Progress Notes (Signed)
Established Patient Office Visit  Subjective:  Patient ID: Breanna Gomez, female    DOB: [birth year]  Age: 60 y.o. MRN: [id number]  CC:  Chief Complaint  Patient presents with  . Hypertension  . Hyperlipidemia    HPI Breanna Gomez presents for   Hypertension- Pt denies chest pain, SOB, dizziness, or heart palpitations.  Taking meds as directed w/o problems.  Denies medication side effects.    Impaired fasting glucose-no increased thirst or urination. No symptoms consistent with hypoglycemia.  Hyperlipidemia - tolerating stating well with no myalgias or significant side effects.  Lab Results  Component Value Date   CHOL 152 10/05/2019   HDL 54 10/05/2019   LDLCALC 78 10/05/2019   TRIG 112 10/05/2019   CHOLHDL 2.8 10/05/2019   F/U GAD - we dec her celexa to 10mg  at last OV bc she was feeling too flat after her husband passed in oct.     Past Medical History:  Diagnosis Date  . Goiter, euthyroid     Past Surgical History:  Procedure Laterality Date  . BREAST SURGERY     cyst removes from r breast  . removed cyst from tailbone     removed cyst   . TONSILLECTOMY  60 yrs old    Family History  Problem Relation Age of Onset  . Hypertension Mother   . Hyperlipidemia Mother   . Alzheimer's disease Mother   . Hyperlipidemia Father   . Hypertension Father   . Diabetes Father   . Hyperlipidemia Sister   . Hypertension Sister   . Diabetes Sister     Social History   Socioeconomic History  . Marital status: Married    Spouse name: Not on file  . Number of children: 2  . Years of education: Not on file  . Highest education level: Not on file  Occupational History    Employer: [employer]  Tobacco Use  . Smoking status: Current Every Day Smoker    Packs/day: 0.50    Years: 40.00    Pack years: 20.00    Types: Cigarettes  . Smokeless tobacco: Never Used  Substance and Sexual Activity  . Alcohol use: No    Alcohol/week: 0.0 standard drinks  . Drug  use: No  . Sexual activity: Yes    Partners: Male  Other Topics Concern  . Not on file  Social History Narrative   No regular exercise.  Daily caffeine intake.    Social Determinants of Health   Financial Resource Strain: Not on file  Food Insecurity: Not on file  Transportation Needs: Not on file  Physical Activity: Not on file  Stress: Not on file  Social Connections: Not on file  Intimate Partner Violence: Not on file    Outpatient Medications Prior to Visit  Medication Sig Dispense Refill  . tretinoin (RETIN-A) 0.05 % cream Apply topically at bedtime.    . Ascorbic Acid (VITAMIN C) 1000 MG tablet Take 1,000 mg by mouth daily.    Nov atorvastatin (LIPITOR) 40 MG tablet Take 1 tablet (40 mg total) by mouth daily. 90 tablet 3  . celecoxib (CELEBREX) 200 MG capsule TAKE ONE OR TWO CAPSULES BY MOUTH DAILY AS NEEDED FOR PAIN 180 capsule 2  . Cholecalciferol (VITAMIN D3 PO) Take by mouth.    . citalopram (CELEXA) 20 MG tablet Take 1 tablet (20 mg total) by mouth daily. 90 tablet 0  . diazepam (VALIUM) 2 MG tablet Take 1 tablet (2 mg total) by mouth  daily as needed for anxiety. 10 tablet 0  . levalbuterol (XOPENEX HFA) 45 MCG/ACT inhaler Inhale 2 puffs into the lungs every 6 (six) hours as needed for wheezing. 15 g 6  . melatonin 3 MG TABS tablet Take 3 mg by mouth at bedtime as needed.    . pantoprazole (PROTONIX) 40 MG tablet Take 1 tablet (40 mg total) by mouth daily. 90 tablet 4  . RESTASIS 0.05 % ophthalmic emulsion 1 drop 2 (two) times daily.    Marland Kitchen losartan-hydrochlorothiazide (HYZAAR) 50-12.5 MG tablet TAKE ONE TABLET BY MOUTH EVERY DAY 90 tablet 1   No facility-administered medications prior to visit.    Allergies  Allergen Reactions  . Nsaids Other (See Comments)    GI ulcers.  GI ulcers.     ROS Review of Systems    Objective:    Physical Exam Constitutional:      Appearance: She is well-developed.  HENT:     Head: Normocephalic and atraumatic.   Cardiovascular:     Rate and Rhythm: Normal rate and regular rhythm.     Heart sounds: Normal heart sounds.  Pulmonary:     Effort: Pulmonary effort is normal.     Breath sounds: Normal breath sounds.  Skin:    General: Skin is warm and dry.  Neurological:     Mental Status: She is alert and oriented to person, place, and time.  Psychiatric:        Behavior: Behavior normal.     BP (!) 142/80   Pulse 71   Ht 5\' 6"  (1.676 m)   Wt 150 lb (68 kg)   LMP 05/12/2000   SpO2 100%   BMI 24.21 kg/m  Wt Readings from Last 3 Encounters:  10/09/20 150 lb (68 kg)  04/10/20 152 lb (68.9 kg)  10/05/19 150 lb (68 kg)     Health Maintenance Due  Topic Date Due  . COVID-19 Vaccine (2 - Booster for 10/07/19 series) 12/20/2019  . Zoster Vaccines- Shingrix (2 of 2) 06/05/2020    There are no preventive care reminders to display for this patient.  Lab Results  Component Value Date   TSH 1.28 02/10/2018   Lab Results  Component Value Date   WBC 9.0 10/05/2019   HGB 15.1 10/05/2019   HCT 44.9 10/05/2019   MCV 90.5 10/05/2019   PLT 362 10/05/2019   Lab Results  Component Value Date   NA 137 04/10/2020   K 5.1 04/10/2020   CO2 32 04/10/2020   GLUCOSE 101 (H) 04/10/2020   BUN 13 04/10/2020   CREATININE 0.78 04/10/2020   BILITOT 0.6 10/05/2019   ALKPHOS 59 01/07/2016   AST 13 10/05/2019   ALT 11 10/05/2019   PROT 6.4 10/05/2019   ALBUMIN 4.3 01/07/2016   CALCIUM 10.6 (H) 04/10/2020   Lab Results  Component Value Date   CHOL 152 10/05/2019   Lab Results  Component Value Date   HDL 54 10/05/2019   Lab Results  Component Value Date   LDLCALC 78 10/05/2019   Lab Results  Component Value Date   TRIG 112 10/05/2019   Lab Results  Component Value Date   CHOLHDL 2.8 10/05/2019   Lab Results  Component Value Date   HGBA1C 5.6 10/09/2020      Assessment & Plan:   Problem List Items Addressed This Visit      Cardiovascular and Mediastinum   HYPERTENSION,  BENIGN ESSENTIAL - Primary    BP borderline today.will recheck before she  leaves.   Plan to increase losartan to 100 mg.  Follow-up in 2 weeks for nurse visit.  Due for labs.        Relevant Medications   losartan-hydrochlorothiazide (HYZAAR) 100-12.5 MG tablet   Other Relevant Orders   COMPLETE METABOLIC PANEL WITH GFR   Lipid panel   CBC     Endocrine   INSULIN RESISTANCE SYNDROME    A1c looks great today at 5.6 even though she reports that her diet has not been great since her husband passed she has not been really cooking on her own and has just been grabbing more quick foods.      Relevant Orders   COMPLETE METABOLIC PANEL WITH GFR   Lipid panel   CBC   POCT glycosylated hemoglobin (Hb A1C) (Completed)     Other   Hyperlipidemia   Relevant Medications   losartan-hydrochlorothiazide (HYZAAR) 100-12.5 MG tablet   Other Relevant Orders   COMPLETE METABOLIC PANEL WITH GFR   Lipid panel   DISORDER, TOBACCO USE    Smoking has ramped up since working from home.  Discussed setting goals and working on decreasing smoking.        ANXIETY DISORDER, GENERALIZED    We discussed options she just wants to stay with the citalopram lower dose for now.  She says overall she feels like it still really effective for her.  GAD-7 score of 16 and PHQ-9 score of 9.  Rates symptoms as somewhat difficult.  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  Work is sort of kept her engaged which is good.       Other Visit Diagnoses    Grief         Grief-offered to refer her for grief counseling if at any point she feels like this would be helpful for her.  Meds ordered this encounter  Medications  . losartan-hydrochlorothiazide (HYZAAR) 100-12.5 MG tablet    Sig: Take 1 tablet by mouth daily.    Dispense:  90 tablet    Refill:  1      Follow-up: Return in about 6 months (around 04/10/2021) for Hypertension.    Nani Gasser, MD

## 2020-10-10 ENCOUNTER — Other Ambulatory Visit: Payer: Self-pay | Admitting: Family Medicine

## 2020-10-10 DIAGNOSIS — E785 Hyperlipidemia, unspecified: Secondary | ICD-10-CM

## 2020-10-10 LAB — LIPID PANEL
Cholesterol: 227 mg/dL — ABNORMAL HIGH (ref <200)
HDL: 55 mg/dL (ref >=50)
LDL Cholesterol (Calc): 149 mg/dL (calc) — ABNORMAL HIGH
Non-HDL Cholesterol (Calc): 172 mg/dL (calc) — ABNORMAL HIGH (ref <130)
Total CHOL/HDL Ratio: 4.1 (calc) (ref <5.0)
Triglycerides: 115 mg/dL (ref <150)

## 2020-10-10 LAB — CBC
HCT: 44.2 % (ref 35.0–45.0)
Hemoglobin: 14.5 g/dL (ref 11.7–15.5)
MCH: 30.8 pg (ref 27.0–33.0)
MCHC: 32.8 g/dL (ref 32.0–36.0)
MCV: 93.8 fL (ref 80.0–100.0)
MPV: 9.1 fL (ref 7.5–12.5)
Platelets: 372 10*3/uL (ref 140–400)
RBC: 4.71 10*6/uL (ref 3.80–5.10)
RDW: 12.4 % (ref 11.0–15.0)
WBC: 7.2 10*3/uL (ref 3.8–10.8)

## 2020-10-10 LAB — COMPLETE METABOLIC PANEL WITH GFR
AG Ratio: 1.8 (calc) (ref 1.0–2.5)
ALT: 10 U/L (ref 6–29)
AST: 14 U/L (ref 10–35)
Albumin: 4.3 g/dL (ref 3.6–5.1)
Alkaline phosphatase (APISO): 74 U/L (ref 37–153)
BUN: 13 mg/dL (ref 7–25)
CO2: 28 mmol/L (ref 20–32)
Calcium: 10.2 mg/dL (ref 8.6–10.4)
Chloride: 102 mmol/L (ref 98–110)
Creat: 0.72 mg/dL (ref 0.50–0.99)
GFR, Est African American: 105 mL/min/{1.73_m2} (ref >=60)
GFR, Est Non African American: 90 mL/min/{1.73_m2} (ref >=60)
Globulin: 2.4 g/dL (calc) (ref 1.9–3.7)
Glucose, Bld: 105 mg/dL — ABNORMAL HIGH (ref 65–99)
Potassium: 5.1 mmol/L (ref 3.5–5.3)
Sodium: 137 mmol/L (ref 135–146)
Total Bilirubin: 0.5 mg/dL (ref 0.2–1.2)
Total Protein: 6.7 g/dL (ref 6.1–8.1)

## 2020-10-10 MED ORDER — ATORVASTATIN CALCIUM 40 MG PO TABS: 40.0000 mg | ORAL_TABLET | Freq: Every day | ORAL | 3 refills | Status: DC

## 2020-10-11 ENCOUNTER — Telehealth: Payer: Self-pay | Admitting: Family Medicine

## 2020-10-11 NOTE — Telephone Encounter (Signed)
Pt advised she will go p/u med and start taking and call back to schedule nv for bp check and 2nd shingrix

## 2020-10-11 NOTE — Telephone Encounter (Signed)
Please call patient and let her know she is due for her second shingles vaccine.  In fact it has been a little bit more than 6 months since her last one so definitely want to make sure that she is up-to-date I apologize for not mentioning that to her when she was here the other day.  We can easily schedule this with a nurse visit if she would like.

## 2020-10-11 NOTE — Telephone Encounter (Signed)
Pt advised. She will call back to schedule an appt.

## 2020-11-08 ENCOUNTER — Ambulatory Visit: Payer: 59 | Admitting: Family Medicine

## 2020-11-22 ENCOUNTER — Other Ambulatory Visit: Payer: Self-pay

## 2020-11-22 ENCOUNTER — Encounter: Payer: Self-pay | Admitting: Family Medicine

## 2020-11-22 ENCOUNTER — Ambulatory Visit: Payer: 59 | Admitting: Family Medicine

## 2020-11-22 VITALS — BP 125/61 | HR 74 | Ht 66.0 in | Wt 148.0 lb

## 2020-11-22 DIAGNOSIS — Z23 Encounter for immunization: Secondary | ICD-10-CM

## 2020-11-22 DIAGNOSIS — I10 Essential (primary) hypertension: Secondary | ICD-10-CM

## 2020-11-22 MED ORDER — LEVALBUTEROL TARTRATE 45 MCG/ACT IN AERO: 2.0000 | INHALATION_SPRAY | Freq: Four times a day (QID) | RESPIRATORY_TRACT | 6 refills | Status: DC | PRN

## 2020-11-22 NOTE — Progress Notes (Signed)
Established Patient Office Visit  Subjective:  Patient ID: Breanna Gomez, female    DOB: February 25, 1961  Age: 60 y.o. MRN: [id number]  CC:  Chief Complaint  Patient presents with   Hypertension    HPI Breanna Gomez presents for   Hypertension- Pt denies chest pain, SOB, dizziness, or heart palpitations.  Taking meds as directed w/o problems.  Denies medication side effects.  We inc her losartan to 100mg .    Past Medical History:  Diagnosis Date   Goiter, euthyroid     Past Surgical History:  Procedure Laterality Date   BREAST SURGERY     cyst removes from r breast   removed cyst from tailbone     removed cyst    TONSILLECTOMY  60 yrs old    Family History  Problem Relation Age of Onset   Hypertension Mother    Hyperlipidemia Mother    Alzheimer's disease Mother    Hyperlipidemia Father    Hypertension Father    Diabetes Father    Hyperlipidemia Sister    Hypertension Sister    Diabetes Sister     Social History   Socioeconomic History   Marital status: Married    Spouse name: Not on file   Number of children: 2   Years of education: Not on file   Highest education level: Not on file  Occupational History    Employer: [employer]  Tobacco Use   Smoking status: Every Day    Packs/day: 0.50    Years: 40.00    Pack years: 20.00    Types: Cigarettes   Smokeless tobacco: Never  Substance and Sexual Activity   Alcohol use: No    Alcohol/week: 0.0 standard drinks   Drug use: No   Sexual activity: Yes    Partners: Male  Other Topics Concern   Not on file  Social History Narrative   No regular exercise.  Daily caffeine intake.    Social Determinants of Health   Financial Resource Strain: Not on file  Food Insecurity: Not on file  Transportation Needs: Not on file  Physical Activity: Not on file  Stress: Not on file  Social Connections: Not on file  Intimate Partner Violence: Not on file    Outpatient Medications Prior to Visit  Medication  Sig Dispense Refill   Ascorbic Acid (VITAMIN C) 1000 MG tablet Take 1,000 mg by mouth daily.     atorvastatin (LIPITOR) 40 MG tablet Take 1 tablet (40 mg total) by mouth daily. 90 tablet 3   celecoxib (CELEBREX) 200 MG capsule TAKE ONE OR TWO CAPSULES BY MOUTH DAILY AS NEEDED FOR PAIN 180 capsule 2   Cholecalciferol (VITAMIN D3 PO) Take by mouth.     citalopram (CELEXA) 20 MG tablet Take 1 tablet (20 mg total) by mouth daily. 90 tablet 0   diazepam (VALIUM) 2 MG tablet Take 1 tablet (2 mg total) by mouth daily as needed for anxiety. 10 tablet 0   losartan-hydrochlorothiazide (HYZAAR) 100-12.5 MG tablet Take 1 tablet by mouth daily. 90 tablet 1   melatonin 3 MG TABS tablet Take 3 mg by mouth at bedtime as needed.     pantoprazole (PROTONIX) 40 MG tablet Take 1 tablet (40 mg total) by mouth daily. 90 tablet 4   RESTASIS 0.05 % ophthalmic emulsion 1 drop 2 (two) times daily.     tretinoin (RETIN-A) 0.05 % cream Apply topically at bedtime.     levalbuterol (XOPENEX HFA) 45 MCG/ACT inhaler Inhale 2  puffs into the lungs every 6 (six) hours as needed for wheezing. 15 g 6   No facility-administered medications prior to visit.    Allergies  Allergen Reactions   Nsaids Other (See Comments)    GI ulcers.  GI ulcers.     ROS Review of Systems    Objective:    Physical Exam Constitutional:      Appearance: Normal appearance. She is well-developed.  HENT:     Head: Normocephalic and atraumatic.  Cardiovascular:     Rate and Rhythm: Normal rate and regular rhythm.     Heart sounds: Normal heart sounds.  Pulmonary:     Effort: Pulmonary effort is normal.     Breath sounds: Normal breath sounds.  Skin:    General: Skin is warm and dry.  Neurological:     Mental Status: She is alert and oriented to person, place, and time.  Psychiatric:        Behavior: Behavior normal.    BP 125/61   Pulse 74   Ht 5\' 6"  (1.676 m)   Wt 148 lb (67.1 kg)   LMP 05/12/2000   SpO2 98%   BMI 23.89  kg/m  Wt Readings from Last 3 Encounters:  11/22/20 148 lb (67.1 kg)  10/09/20 150 lb (68 kg)  04/10/20 152 lb (68.9 kg)     Health Maintenance Due  Topic Date Due   Pneumococcal Vaccine 85-33 Years old (1 - PCV) Never done   COVID-19 Vaccine (2 - Booster for Janssen series) 12/20/2019   COLONOSCOPY (Pts 45-23yrs Insurance coverage will need to be confirmed)  11/22/2020    There are no preventive care reminders to display for this patient.  Lab Results  Component Value Date   TSH 1.28 02/10/2018   Lab Results  Component Value Date   WBC 7.2 10/09/2020   HGB 14.5 10/09/2020   HCT 44.2 10/09/2020   MCV 93.8 10/09/2020   PLT 372 10/09/2020   Lab Results  Component Value Date   NA 137 10/09/2020   K 5.1 10/09/2020   CO2 28 10/09/2020   GLUCOSE 105 (H) 10/09/2020   BUN 13 10/09/2020   CREATININE 0.72 10/09/2020   BILITOT 0.5 10/09/2020   ALKPHOS 59 01/07/2016   AST 14 10/09/2020   ALT 10 10/09/2020   PROT 6.7 10/09/2020   ALBUMIN 4.3 01/07/2016   CALCIUM 10.2 10/09/2020   Lab Results  Component Value Date   CHOL 227 (H) 10/09/2020   Lab Results  Component Value Date   HDL 55 10/09/2020   Lab Results  Component Value Date   LDLCALC 149 (H) 10/09/2020   Lab Results  Component Value Date   TRIG 115 10/09/2020   Lab Results  Component Value Date   CHOLHDL 4.1 10/09/2020   Lab Results  Component Value Date   HGBA1C 5.6 10/09/2020      Assessment & Plan:   Problem List Items Addressed This Visit       Cardiovascular and Mediastinum   HYPERTENSION, BENIGN ESSENTIAL - Primary    BP looks great on new dose of BP medication.        Other Visit Diagnoses     Need for Zostavax administration       Relevant Orders   Varicella-zoster vaccine IM (Shingrix) (Completed)       Meds ordered this encounter  Medications   levalbuterol (XOPENEX HFA) 45 MCG/ACT inhaler    Sig: Inhale 2 puffs into the lungs every 6 (six)  hours as needed for wheezing.     Dispense:  15 g    Refill:  6    Follow-up: Return if symptoms worsen or fail to improve.    Breanna Gasser, MD

## 2020-11-22 NOTE — Assessment & Plan Note (Signed)
BP looks great on new dose of BP medication.

## 2021-01-17 ENCOUNTER — Ambulatory Visit: Payer: 59 | Admitting: Family Medicine

## 2021-01-23 ENCOUNTER — Ambulatory Visit: Payer: 59 | Admitting: Sports Medicine

## 2021-01-23 ENCOUNTER — Other Ambulatory Visit: Payer: Self-pay

## 2021-01-23 DIAGNOSIS — M19012 Primary osteoarthritis, left shoulder: Secondary | ICD-10-CM

## 2021-01-23 DIAGNOSIS — M5416 Radiculopathy, lumbar region: Secondary | ICD-10-CM | POA: Diagnosis not present

## 2021-01-23 DIAGNOSIS — G950 Syringomyelia and syringobulbia: Secondary | ICD-10-CM

## 2021-01-23 MED ORDER — CELECOXIB 200 MG PO CAPS: ORAL_CAPSULE | ORAL | 2 refills | Status: DC

## 2021-01-23 MED ORDER — TRAMADOL HCL 50 MG PO TABS: 50.0000 mg | ORAL_TABLET | Freq: Three times a day (TID) | ORAL | 0 refills | Status: DC | PRN

## 2021-01-23 NOTE — Assessment & Plan Note (Signed)
Lameka will also follow-up with her neurosurgeon regarding her neck pain and syrinx.

## 2021-01-23 NOTE — Assessment & Plan Note (Signed)
Shoulder osteoarthritis, I have not seen this pleasant 60 year old female in 2 years, her husband did pass away in the meantime. We will refill her Celebrex and tramadol which tends to hold her over, if insufficient improvement she can return for a glenohumeral injection.

## 2021-01-23 NOTE — Progress Notes (Signed)
    Procedures performed today:    None.  Independent interpretation of notes and tests performed by another provider:   None.  Brief History, Exam, Impression, and Recommendations:    Primary osteoarthritis of left shoulder Shoulder osteoarthritis, I have not seen this pleasant 60 year old female in 2 years, her husband did pass away in the meantime. We will refill her Celebrex and tramadol which tends to hold her over, if insufficient improvement she can return for a glenohumeral injection.  Syrinx of spinal cord (HCC) Jullian will also follow-up with her neurosurgeon regarding her neck pain and syrinx.  Chronic process with exacerbation and pharmacologic intervention   ___________________________________________ Ihor Austin. Benjamin Stain, M.D., ABFM., CAQSM. Primary Care and Sports Medicine [facility]  Adjunct Instructor of Family Medicine  [employer]

## 2021-03-11 ENCOUNTER — Other Ambulatory Visit: Payer: Self-pay | Admitting: Family Medicine

## 2021-03-11 DIAGNOSIS — K2100 Gastro-esophageal reflux disease with esophagitis, without bleeding: Secondary | ICD-10-CM

## 2021-03-11 DIAGNOSIS — E785 Hyperlipidemia, unspecified: Secondary | ICD-10-CM

## 2021-04-11 ENCOUNTER — Other Ambulatory Visit: Payer: Self-pay

## 2021-04-11 ENCOUNTER — Encounter: Payer: 59 | Admitting: Family Medicine

## 2021-04-11 ENCOUNTER — Encounter: Payer: Self-pay | Admitting: Family Medicine

## 2021-04-11 ENCOUNTER — Ambulatory Visit (INDEPENDENT_AMBULATORY_CARE_PROVIDER_SITE_OTHER): Payer: 59 | Admitting: Family Medicine

## 2021-04-11 VITALS — BP 121/57 | HR 74 | Ht 66.0 in | Wt 147.0 lb

## 2021-04-11 DIAGNOSIS — I10 Essential (primary) hypertension: Secondary | ICD-10-CM | POA: Diagnosis not present

## 2021-04-11 DIAGNOSIS — Z23 Encounter for immunization: Secondary | ICD-10-CM

## 2021-04-11 DIAGNOSIS — E348 Other specified endocrine disorders: Secondary | ICD-10-CM | POA: Diagnosis not present

## 2021-04-11 LAB — POCT GLYCOSYLATED HEMOGLOBIN (HGB A1C): Hemoglobin A1C: 5.7 % — AB (ref 4.0–5.6)

## 2021-04-11 NOTE — Assessment & Plan Note (Signed)
Well controlled. Continue current regimen. Follow up in  6 mo  

## 2021-04-11 NOTE — Progress Notes (Signed)
Established Patient Office Visit  Subjective:  Patient ID: Breanna Gomez, female    DOB: 1960-10-27  Age: 60 y.o. MRN: [id number]  CC:  Chief Complaint  Patient presents with   ifg   Hypertension    HPI Breanna Gomez presents for 41-month follow-up.  She is doing well overall.  Hypertension- Pt denies chest pain, SOB, dizziness, or heart palpitations.  Taking meds as directed w/o problems.  Denies medication side effects.    Impaired fasting glucose-no increased thirst or urination. No symptoms consistent with hypoglycemia.  She is okay with getting her flu vaccine today.  Past Medical History:  Diagnosis Date   Goiter, euthyroid     Past Surgical History:  Procedure Laterality Date   BREAST SURGERY     cyst removes from r breast   removed cyst from tailbone     removed cyst    TONSILLECTOMY  60 yrs old    Family History  Problem Relation Age of Onset   Hypertension Mother    Hyperlipidemia Mother    Alzheimer's disease Mother    Hyperlipidemia Father    Hypertension Father    Diabetes Father    Hyperlipidemia Sister    Hypertension Sister    Diabetes Sister     Social History   Socioeconomic History   Marital status: Married    Spouse name: Not on file   Number of children: 2   Years of education: Not on file   Highest education level: Not on file  Occupational History    Employer: [employer]  Tobacco Use   Smoking status: Every Day    Packs/day: 0.50    Years: 40.00    Pack years: 20.00    Types: Cigarettes   Smokeless tobacco: Never  Substance and Sexual Activity   Alcohol use: No    Alcohol/week: 0.0 standard drinks   Drug use: No   Sexual activity: Yes    Partners: Male  Other Topics Concern   Not on file  Social History Narrative   No regular exercise.  Daily caffeine intake.    Social Determinants of Health   Financial Resource Strain: Not on file  Food Insecurity: Not on file  Transportation Needs: Not on file  Physical  Activity: Not on file  Stress: Not on file  Social Connections: Not on file  Intimate Partner Violence: Not on file    Outpatient Medications Prior to Visit  Medication Sig Dispense Refill   Ascorbic Acid (VITAMIN C) 1000 MG tablet Take 1,000 mg by mouth daily.     atorvastatin (LIPITOR) 40 MG tablet Take 1 tablet (40 mg total) by mouth daily. 90 tablet 0   celecoxib (CELEBREX) 200 MG capsule TAKE ONE OR TWO CAPSULES BY MOUTH DAILY AS NEEDED FOR PAIN 180 capsule 2   Cholecalciferol (VITAMIN D3 PO) Take by mouth.     citalopram (CELEXA) 20 MG tablet Take 1 tablet (20 mg total) by mouth daily. 90 tablet 0   diazepam (VALIUM) 2 MG tablet Take 1 tablet (2 mg total) by mouth daily as needed for anxiety. 10 tablet 0   levalbuterol (XOPENEX HFA) 45 MCG/ACT inhaler Inhale 2 puffs into the lungs every 6 (six) hours as needed for wheezing. 15 g 6   losartan-hydrochlorothiazide (HYZAAR) 100-12.5 MG tablet Take 1 tablet by mouth daily. 90 tablet 0   melatonin 3 MG TABS tablet Take 3 mg by mouth at bedtime as needed.     Multiple Vitamins-Minerals (ZINC PO)  Take by mouth.     pantoprazole (PROTONIX) 40 MG tablet Take 1 tablet (40 mg total) by mouth daily. 90 tablet 0   RESTASIS 0.05 % ophthalmic emulsion 1 drop 2 (two) times daily.     traMADol (ULTRAM) 50 MG tablet Take 1-2 tablets (50-100 mg total) by mouth every 8 (eight) hours as needed for moderate pain. Maximum 6 tabs per day. 21 tablet 0   tretinoin (RETIN-A) 0.05 % cream Apply topically at bedtime.     No facility-administered medications prior to visit.    Allergies  Allergen Reactions   Nsaids Other (See Comments)    GI ulcers.  GI ulcers.     ROS Review of Systems    Objective:    Physical Exam Constitutional:      Appearance: Normal appearance. She is well-developed.  HENT:     Head: Normocephalic and atraumatic.  Cardiovascular:     Rate and Rhythm: Normal rate and regular rhythm.     Heart sounds: Normal heart sounds.   Pulmonary:     Effort: Pulmonary effort is normal.     Breath sounds: Normal breath sounds.  Skin:    General: Skin is warm and dry.  Neurological:     Mental Status: She is alert and oriented to person, place, and time.  Psychiatric:        Behavior: Behavior normal.    BP (!) 121/57   Pulse 74   Ht 5\' 6"  (1.676 m)   Wt 147 lb (66.7 kg)   LMP 05/12/2000   SpO2 100%   BMI 23.73 kg/m  Wt Readings from Last 3 Encounters:  04/11/21 147 lb (66.7 kg)  11/22/20 148 lb (67.1 kg)  10/09/20 150 lb (68 kg)     Health Maintenance Due  Topic Date Due   Pneumococcal Vaccine 52-71 Years old (1 - PCV) Never done   COVID-19 Vaccine (2 - Booster for Janssen series) 12/20/2019   COLONOSCOPY (Pts 45-54yrs Insurance coverage will need to be confirmed)  11/22/2020    There are no preventive care reminders to display for this patient.  Lab Results  Component Value Date   TSH 1.28 02/10/2018   Lab Results  Component Value Date   WBC 7.2 10/09/2020   HGB 14.5 10/09/2020   HCT 44.2 10/09/2020   MCV 93.8 10/09/2020   PLT 372 10/09/2020   Lab Results  Component Value Date   NA 137 10/09/2020   K 5.1 10/09/2020   CO2 28 10/09/2020   GLUCOSE 105 (H) 10/09/2020   BUN 13 10/09/2020   CREATININE 0.72 10/09/2020   BILITOT 0.5 10/09/2020   ALKPHOS 59 01/07/2016   AST 14 10/09/2020   ALT 10 10/09/2020   PROT 6.7 10/09/2020   ALBUMIN 4.3 01/07/2016   CALCIUM 10.2 10/09/2020   Lab Results  Component Value Date   CHOL 227 (H) 10/09/2020   Lab Results  Component Value Date   HDL 55 10/09/2020   Lab Results  Component Value Date   LDLCALC 149 (H) 10/09/2020   Lab Results  Component Value Date   TRIG 115 10/09/2020   Lab Results  Component Value Date   CHOLHDL 4.1 10/09/2020   Lab Results  Component Value Date   HGBA1C 5.7 (A) 04/11/2021      Assessment & Plan:   Problem List Items Addressed This Visit       Cardiovascular and Mediastinum   HYPERTENSION, BENIGN  ESSENTIAL    Well controlled. Continue current regimen.  Follow up in  6 mo         Endocrine   INSULIN RESISTANCE SYNDROME - Primary    Well controlled. Continue current regimen. Follow up in  6 mo       Relevant Orders   POCT glycosylated hemoglobin (Hb A1C) (Completed)   Other Visit Diagnoses     Need for immunization against influenza       Relevant Orders   Flu Vaccine QUAD 35mo+IM (Fluarix, Fluzone & Alfiuria Quad PF) (Completed)       No orders of the defined types were placed in this encounter.   Follow-up: Return in about 6 months (around 10/10/2021) for Prediabetes.    Nani Gasser, MD

## 2021-04-12 NOTE — Progress Notes (Signed)
Error

## 2021-06-14 ENCOUNTER — Other Ambulatory Visit: Payer: Self-pay | Admitting: Family Medicine

## 2021-06-14 ENCOUNTER — Other Ambulatory Visit: Payer: Self-pay

## 2021-06-14 ENCOUNTER — Ambulatory Visit: Payer: 59 | Admitting: Sports Medicine

## 2021-06-14 DIAGNOSIS — K2100 Gastro-esophageal reflux disease with esophagitis, without bleeding: Secondary | ICD-10-CM | POA: Diagnosis not present

## 2021-06-14 DIAGNOSIS — R1011 Right upper quadrant pain: Secondary | ICD-10-CM | POA: Diagnosis not present

## 2021-06-14 MED ORDER — FAMOTIDINE 40 MG PO TABS: 40.0000 mg | ORAL_TABLET | Freq: Two times a day (BID) | ORAL | 0 refills | Status: DC

## 2021-06-14 MED ORDER — PANTOPRAZOLE SODIUM 40 MG PO TBEC: 40.0000 mg | DELAYED_RELEASE_TABLET | Freq: Two times a day (BID) | ORAL | 0 refills | Status: DC

## 2021-06-14 NOTE — Assessment & Plan Note (Addendum)
This is a pleasant 61 year old female, she is long history of intermittent abdominal pain, a decade or so ago she did have an upper endoscopy that was completely unremarkable and the suspicion from gastroenterology was that this was biliary colic. About 5 years ago she had an abdominal ultrasound that did show some gallbladder polyps. More recently she has been having increasing episodic abdominal pain coming and going in attacks, she localizes it in the epigastrium with radiation between the shoulder blades. No melena, hematochezia, hematemesis, no sour brash. Episodes do not seem to be associated with any specific foods. They are not associated with exertion and outcome with constitutional symptoms or any other cardiac type symptoms. We will get some labs, right upper quadrant ultrasound and I do think that if this is negative we will proceed with HIDA scan and likely referral to general surgery. In the meantime increase famotidine and pantoprazole to twice a day. Hold off on Celebrex for now.  Update: Ultrasound does show gallbladder polyps, I would like general surgery to weigh in, as with this finding I am not sure if they still need a HIDA scan.

## 2021-06-14 NOTE — Progress Notes (Addendum)
° ° °  Procedures performed today:    None.  Independent interpretation of notes and tests performed by another provider:   None.  Brief History, Exam, Impression, and Recommendations:    Abdominal pain, acute, right upper quadrant This is a pleasant 61 year old female, she is long history of intermittent abdominal pain, a decade or so ago she did have an upper endoscopy that was completely unremarkable and the suspicion from gastroenterology was that this was biliary colic. About 5 years ago she had an abdominal ultrasound that did show some gallbladder polyps. More recently she has been having increasing episodic abdominal pain coming and going in attacks, she localizes it in the epigastrium with radiation between the shoulder blades. No melena, hematochezia, hematemesis, no sour brash. Episodes do not seem to be associated with any specific foods. They are not associated with exertion and outcome with constitutional symptoms or any other cardiac type symptoms. We will get some labs, right upper quadrant ultrasound and I do think that if this is negative we will proceed with HIDA scan and likely referral to general surgery. In the meantime increase famotidine and pantoprazole to twice a day. Hold off on Celebrex for now.  Update: Ultrasound does show gallbladder polyps, I would like general surgery to weigh in, as with this finding I am not sure if they still need a HIDA scan.    ___________________________________________ Ihor Austin. Benjamin Stain, M.D., ABFM., CAQSM. Primary Care and Sports Medicine [facility] [facility]  Adjunct Instructor of Family Medicine  [employer]

## 2021-06-15 LAB — CBC WITH DIFFERENTIAL/PLATELET
Absolute Monocytes: 567 cells/uL (ref 200–950)
Basophils Absolute: 87 cells/uL (ref 0–200)
Basophils Relative: 0.8 %
Eosinophils Absolute: 196 cells/uL (ref 15–500)
Eosinophils Relative: 1.8 %
HCT: 43.9 % (ref 35.0–45.0)
Hemoglobin: 15.2 g/dL (ref 11.7–15.5)
Lymphs Abs: 3161 cells/uL (ref 850–3900)
MCH: 31.3 pg (ref 27.0–33.0)
MCHC: 34.6 g/dL (ref 32.0–36.0)
MCV: 90.5 fL (ref 80.0–100.0)
MPV: 9.4 fL (ref 7.5–12.5)
Monocytes Relative: 5.2 %
Neutro Abs: 6889 cells/uL (ref 1500–7800)
Neutrophils Relative %: 63.2 %
Platelets: 384 10*3/uL (ref 140–400)
RBC: 4.85 10*6/uL (ref 3.80–5.10)
RDW: 11.7 % (ref 11.0–15.0)
Total Lymphocyte: 29 %
WBC: 10.9 10*3/uL — ABNORMAL HIGH (ref 3.8–10.8)

## 2021-06-15 LAB — COMPREHENSIVE METABOLIC PANEL
AG Ratio: 1.8 (calc) (ref 1.0–2.5)
ALT: 14 U/L (ref 6–29)
AST: 14 U/L (ref 10–35)
Albumin: 4.5 g/dL (ref 3.6–5.1)
Alkaline phosphatase (APISO): 70 U/L (ref 37–153)
BUN: 18 mg/dL (ref 7–25)
CO2: 30 mmol/L (ref 20–32)
Calcium: 11 mg/dL — ABNORMAL HIGH (ref 8.6–10.4)
Chloride: 97 mmol/L — ABNORMAL LOW (ref 98–110)
Creat: 0.77 mg/dL (ref 0.50–1.05)
Globulin: 2.5 g/dL (calc) (ref 1.9–3.7)
Glucose, Bld: 93 mg/dL (ref 65–99)
Potassium: 4.8 mmol/L (ref 3.5–5.3)
Sodium: 134 mmol/L — ABNORMAL LOW (ref 135–146)
Total Bilirubin: 0.5 mg/dL (ref 0.2–1.2)
Total Protein: 7 g/dL (ref 6.1–8.1)

## 2021-06-15 LAB — AMYLASE: Amylase: 29 U/L (ref 21–101)

## 2021-06-15 LAB — LIPASE: Lipase: 31 U/L (ref 7–60)

## 2021-06-17 ENCOUNTER — Other Ambulatory Visit: Payer: Self-pay

## 2021-06-17 ENCOUNTER — Ambulatory Visit (INDEPENDENT_AMBULATORY_CARE_PROVIDER_SITE_OTHER): Payer: 59

## 2021-06-17 DIAGNOSIS — R1011 Right upper quadrant pain: Secondary | ICD-10-CM | POA: Diagnosis not present

## 2021-06-17 NOTE — Addendum Note (Signed)
Addended by: Monica Becton on: 06/17/2021 04:47 PM   Modules accepted: Orders

## 2021-08-20 LAB — HM COLONOSCOPY

## 2021-09-09 ENCOUNTER — Other Ambulatory Visit: Payer: Self-pay | Admitting: Family Medicine

## 2021-09-09 DIAGNOSIS — E785 Hyperlipidemia, unspecified: Secondary | ICD-10-CM

## 2021-09-20 ENCOUNTER — Encounter: Payer: Self-pay | Admitting: Family Medicine

## 2021-10-10 ENCOUNTER — Ambulatory Visit: Payer: 59 | Admitting: Family Medicine

## 2021-10-10 ENCOUNTER — Encounter: Payer: Self-pay | Admitting: Family Medicine

## 2021-10-10 VITALS — BP 110/52 | HR 67 | Resp 16 | Ht 66.0 in | Wt 148.0 lb

## 2021-10-10 DIAGNOSIS — F902 Attention-deficit hyperactivity disorder, combined type: Secondary | ICD-10-CM | POA: Diagnosis not present

## 2021-10-10 DIAGNOSIS — E348 Other specified endocrine disorders: Secondary | ICD-10-CM

## 2021-10-10 DIAGNOSIS — E785 Hyperlipidemia, unspecified: Secondary | ICD-10-CM

## 2021-10-10 DIAGNOSIS — I10 Essential (primary) hypertension: Secondary | ICD-10-CM

## 2021-10-10 DIAGNOSIS — F411 Generalized anxiety disorder: Secondary | ICD-10-CM

## 2021-10-10 LAB — POCT GLYCOSYLATED HEMOGLOBIN (HGB A1C): Hemoglobin A1C: 5.7 % — AB (ref 4.0–5.6)

## 2021-10-10 MED ORDER — LISDEXAMFETAMINE DIMESYLATE 30 MG PO CAPS: 30.0000 mg | ORAL_CAPSULE | Freq: Every day | ORAL | 0 refills | Status: DC

## 2021-10-10 MED ORDER — CLONAZEPAM 0.5 MG PO TABS: 0.2500 mg | ORAL_TABLET | Freq: Every day | ORAL | 0 refills | Status: DC | PRN

## 2021-10-10 NOTE — Progress Notes (Addendum)
Established Patient Office Visit    Subjective   Patient ID: ANALEESE Gomez, female    DOB: Jul 22, 1960  Age: 61 y.o. MRN: [id number]  Chief Complaint  Patient presents with   Impaired Fasting Glucose     Follow up    Hypertension    Follow up     Difficulty Focusing     Patient would like to discuss starting medication to help her focus    Anxiety    Patient states anxiety is getting worse. Patient stopped Valium because it did not help with her anxiety.     HPI  Impaired fasting glucose-no increased thirst or urination. No symptoms consistent with hypoglycemia.  Hypertension- Pt denies chest pain, SOB, dizziness, or heart palpitations.  Taking meds as directed w/o problems.  Denies medication side effects.    F/U Anxiety - she it feeling her anxiety is getting worse.  She said she was really a little nervous to try the diazepam but did take it couple of times she usually would take a half a tab.  She just feels like it has a bad wrap.  She is willing to consider going back to the clonazepam.  It made her feel a little sleepy when she had used it before.  She says she will definitely use it sparingly.  ADHD-she just really noticed that she is having a lot of difficulty at work staying on task and staying focused.  She says she will have to read the same thing multiple times to really processed that and is just getting very frustrating.  She says years ago she was diagnosed with ADHD by psychiatry.  And she was treated with methylphenidate and Adderall she remembers having side effects with both and then finally was placed on Vyvanse which she did well on.  But then it started to affect her blood pressure numbers.  So she came off of it probably about 15 years ago.     ROS    Objective:     BP (!) 110/52   Pulse 67   Resp 16   Ht 5\' 6"  (1.676 m)   Wt 148 lb (67.1 kg)   LMP 05/12/2000   SpO2 98%   BMI 23.89 kg/m    Physical Exam Vitals and nursing note reviewed.   Constitutional:      Appearance: She is well-developed.  HENT:     Head: Normocephalic and atraumatic.  Cardiovascular:     Rate and Rhythm: Normal rate and regular rhythm.     Heart sounds: Normal heart sounds.  Pulmonary:     Effort: Pulmonary effort is normal.     Breath sounds: Normal breath sounds.  Skin:    General: Skin is warm and dry.  Neurological:     Mental Status: She is alert and oriented to person, place, and time.  Psychiatric:        Behavior: Behavior normal.     Results for orders placed or performed in visit on 10/10/21  POCT HgB A1C  Result Value Ref Range   Hemoglobin A1C 5.7 (A) 4.0 - 5.6 %   HbA1c POC (<> result, manual entry)     HbA1c, POC (prediabetic range)     HbA1c, POC (controlled diabetic range)        The 10-year ASCVD risk score (Arnett DK, et al., 2019) is: 8.7%    Assessment & Plan:   Problem List Items Addressed This Visit       Cardiovascular and  Mediastinum   HYPERTENSION, BENIGN ESSENTIAL    Well controlled. Continue current regimen. Follow up in  51mo          Endocrine   INSULIN RESISTANCE SYNDROME - Primary    1C looks great today.  Continue current regimen.  Follow-up in 6 months.       Relevant Orders   POCT HgB A1C (Completed)     Other   Hyperlipidemia   Relevant Orders   Lipid Panel w/reflex Direct LDL   Attention deficit hyperactivity disorder (ADHD), combined type    Discussed options.  We could always restart a low-dose of Vyvanse but we will have to monitor her blood pressure very carefully.  Blood pressure looks phenomenal today.  She has a diagnosis and is currently treated for hypertension so it does make me wonder if that was really just the beginning of onset of hypertension or if it really was a side effect of the medication.  But we will find out.  Low up in 1 month.       Relevant Medications   lisdexamfetamine (VYVANSE) 30 MG capsule   ANXIETY DISORDER, GENERALIZED    Discontinue diazepam  and will switch back to clonazepam.  Monitor for excess sedation.  We discussed options.  Including going up on her citalopram but she says when she takes a full 20 mg she actually feels too flat and does not like that feeling.  But she is also not interested in switching off the citalopram she feels like she has been on it for years and it is one of the better medications that is work for her so she would rather stick with the 10 mg.  Also let her know we can always switch her back to the 10 mg that she does not have to split them but she would prefer to leave it the way it is today.       Relevant Medications   clonazePAM (KLONOPIN) 0.5 MG tablet    ADHD-because she is over the age of 12 and we are considering restarting a stimulant I wanted to get a baseline EKG.  EKG today shows normal sinus rhythm, rate of 64 bpm, no acute ST-T wave changes.  It honestly looks great.  No follow-ups on file.    Breanna Gasser, MD

## 2021-10-10 NOTE — Assessment & Plan Note (Signed)
1C looks great today.  Continue current regimen.  Follow-up in 6 months.

## 2021-10-10 NOTE — Assessment & Plan Note (Signed)
Well controlled. Continue current regimen. Follow up in  6 mo  

## 2021-10-10 NOTE — Assessment & Plan Note (Signed)
Discussed options.  We could always restart a low-dose of Vyvanse but we will have to monitor her blood pressure very carefully.  Blood pressure looks phenomenal today.  She has a diagnosis and is currently treated for hypertension so it does make me wonder if that was really just the beginning of onset of hypertension or if it really was a side effect of the medication.  But we will find out.  Low up in 1 month.

## 2021-10-10 NOTE — Assessment & Plan Note (Addendum)
Discontinue diazepam and will switch back to clonazepam.  Monitor for excess sedation.  We discussed options.  Including going up on her citalopram but she says when she takes a full 20 mg she actually feels too flat and does not like that feeling.  But she is also not interested in switching off the citalopram she feels like she has been on it for years and it is one of the better medications that is work for her so she would rather stick with the 10 mg.  Also let her know we can always switch her back to the 10 mg that she does not have to split them but she would prefer to leave it the way it is today.

## 2021-10-11 LAB — LIPID PANEL W/REFLEX DIRECT LDL
Cholesterol: 132 mg/dL (ref <200)
HDL: 55 mg/dL (ref >=50)
LDL Cholesterol (Calc): 60 mg/dL (calc)
Non-HDL Cholesterol (Calc): 77 mg/dL (calc) (ref <130)
Total CHOL/HDL Ratio: 2.4 (calc) (ref <5.0)
Triglycerides: 90 mg/dL (ref <150)

## 2021-10-11 NOTE — Addendum Note (Signed)
Addended by: Chalmers Cater on: 10/11/2021 02:18 PM   Modules accepted: Orders

## 2021-10-11 NOTE — Progress Notes (Signed)
Cholesterol looks so much better!!

## 2021-12-16 ENCOUNTER — Other Ambulatory Visit: Payer: Self-pay | Admitting: Family Medicine

## 2021-12-16 DIAGNOSIS — E785 Hyperlipidemia, unspecified: Secondary | ICD-10-CM

## 2021-12-20 LAB — HM MAMMOGRAPHY

## 2022-01-14 ENCOUNTER — Other Ambulatory Visit: Payer: Self-pay | Admitting: Sports Medicine

## 2022-01-14 ENCOUNTER — Other Ambulatory Visit: Payer: Self-pay | Admitting: Family Medicine

## 2022-01-14 DIAGNOSIS — M5416 Radiculopathy, lumbar region: Secondary | ICD-10-CM

## 2022-01-14 DIAGNOSIS — M19012 Primary osteoarthritis, left shoulder: Secondary | ICD-10-CM

## 2022-01-25 ENCOUNTER — Encounter: Payer: Self-pay | Admitting: Family Medicine

## 2022-02-13 ENCOUNTER — Other Ambulatory Visit: Payer: Self-pay | Admitting: Family Medicine

## 2022-02-13 DIAGNOSIS — F411 Generalized anxiety disorder: Secondary | ICD-10-CM

## 2022-02-13 DIAGNOSIS — E785 Hyperlipidemia, unspecified: Secondary | ICD-10-CM

## 2022-02-14 NOTE — Telephone Encounter (Signed)
Last OV: 10/10/21  Next OV: 04/17/22 Last RF: 10/10/21

## 2022-03-14 ENCOUNTER — Other Ambulatory Visit: Payer: Self-pay | Admitting: Family Medicine

## 2022-03-14 DIAGNOSIS — Z8709 Personal history of other diseases of the respiratory system: Secondary | ICD-10-CM

## 2022-03-20 ENCOUNTER — Ambulatory Visit: Payer: 59 | Admitting: Family Medicine

## 2022-03-20 ENCOUNTER — Encounter: Payer: Self-pay | Admitting: Family Medicine

## 2022-03-20 VITALS — BP 120/60 | HR 73 | Ht 66.0 in | Wt 141.0 lb

## 2022-03-20 DIAGNOSIS — J329 Chronic sinusitis, unspecified: Secondary | ICD-10-CM | POA: Diagnosis not present

## 2022-03-20 DIAGNOSIS — J40 Bronchitis, not specified as acute or chronic: Secondary | ICD-10-CM | POA: Diagnosis not present

## 2022-03-20 MED ORDER — DOXYCYCLINE HYCLATE 100 MG PO TABS: 100.0000 mg | ORAL_TABLET | Freq: Two times a day (BID) | ORAL | 0 refills | Status: AC

## 2022-03-20 MED ORDER — FLUTICASONE-SALMETEROL 100-50 MCG/ACT IN AEPB: 1.0000 | INHALATION_SPRAY | Freq: Two times a day (BID) | RESPIRATORY_TRACT | 3 refills | Status: DC

## 2022-03-20 MED ORDER — HYDROCOD POLI-CHLORPHE POLI ER 10-8 MG/5ML PO SUER: 5.0000 mL | Freq: Two times a day (BID) | ORAL | 0 refills | Status: DC | PRN

## 2022-03-20 NOTE — Progress Notes (Signed)
Acute Office Visit  Subjective:     Patient ID: Breanna Gomez, female    DOB: 03/02/1961, 61 y.o.   MRN: [id number]  Chief Complaint  Patient presents with   Cough   Nasal Congestion    Cough Pertinent negatives include no chest pain, chills, fever, headaches or shortness of breath.   Patient is in today for 3 weeks of chest congestion and cough. She has a hx of allergies and has been using daily zyrtec. Notes sinus pressure and tenderness especially around her eyes and forehead.   Review of Systems  Constitutional:  Negative for chills and fever.  Respiratory:  Positive for cough. Negative for shortness of breath.   Cardiovascular:  Negative for chest pain.  Neurological:  Negative for headaches.        Objective:    BP 120/60   Pulse 73   Ht 5\' 6"  (1.676 m)   Wt 141 lb (64 kg)   LMP 05/12/2000   BMI 22.76 kg/m    Physical Exam Vitals and nursing note reviewed.  Constitutional:      General: She is not in acute distress.    Appearance: Normal appearance.  HENT:     Head: Normocephalic and atraumatic.     Comments: Sinus tenderness to palpation of maxillary and frontal sinuses bilateral    Right Ear: External ear normal.     Left Ear: External ear normal.     Nose: Nose normal.  Eyes:     Conjunctiva/sclera: Conjunctivae normal.  Cardiovascular:     Rate and Rhythm: Normal rate and regular rhythm.  Pulmonary:     Effort: Pulmonary effort is normal.     Breath sounds: Normal breath sounds.  Neurological:     General: No focal deficit present.     Mental Status: She is alert and oriented to person, place, and time.  Psychiatric:        Mood and Affect: Mood normal.        Behavior: Behavior normal.        Thought Content: Thought content normal.        Judgment: Judgment normal.     No results found for any visits on 03/20/22.      Assessment & Plan:   Problem List Items Addressed This Visit       Respiratory   Sinobronchitis - Primary     - 3 weeks of congestion and sinus pain/pressure  - will treat with doxycycline  - wheezing on lung exam-have sent in advair for patient to use  - for cough sent in tussinex to have better control  - if no better in 7-10 days follow up       Relevant Medications   fluticasone-salmeterol (ADVAIR) 100-50 MCG/ACT AEPB   doxycycline (VIBRA-TABS) 100 MG tablet   chlorpheniramine-HYDROcodone (TUSSIONEX) 10-8 MG/5ML    Meds ordered this encounter  Medications   fluticasone-salmeterol (ADVAIR) 100-50 MCG/ACT AEPB    Sig: Inhale 1 puff into the lungs 2 (two) times daily.    Dispense:  1 each    Refill:  3   doxycycline (VIBRA-TABS) 100 MG tablet    Sig: Take 1 tablet (100 mg total) by mouth 2 (two) times daily for 7 days.    Dispense:  14 tablet    Refill:  0   chlorpheniramine-HYDROcodone (TUSSIONEX) 10-8 MG/5ML    Sig: Take 5 mLs by mouth every 12 (twelve) hours as needed for cough (cough, will cause drowsiness.).  Dispense:  120 mL    Refill:  0    Return in about 10 days (around 03/30/2022) for if no better.  Charlton Amor, DO

## 2022-03-20 NOTE — Assessment & Plan Note (Signed)
-   3 weeks of congestion and sinus pain/pressure  - will treat with doxycycline  - wheezing on lung exam-have sent in advair for patient to use  - for cough sent in tussinex to have better control  - if no better in 7-10 days follow up

## 2022-04-02 ENCOUNTER — Ambulatory Visit: Payer: 59 | Admitting: Sports Medicine

## 2022-04-02 DIAGNOSIS — M19012 Primary osteoarthritis, left shoulder: Secondary | ICD-10-CM | POA: Diagnosis not present

## 2022-04-02 DIAGNOSIS — M5416 Radiculopathy, lumbar region: Secondary | ICD-10-CM

## 2022-04-02 MED ORDER — CELECOXIB 200 MG PO CAPS: ORAL_CAPSULE | ORAL | 3 refills | Status: DC

## 2022-04-02 NOTE — Assessment & Plan Note (Signed)
Breanna Gomez returns, known lumbar DDD with color contact of the right L2-L3 disc in the right L2 nerve root, she historically did really well with Celebrex twice a day, and never needed epidurals with Dr. Laurian Brim. Refilling medication, return as needed.

## 2022-04-02 NOTE — Assessment & Plan Note (Signed)
Stable with Celebrex twice a day, refilling. Glenohumeral injection can be used if needed in the future.

## 2022-04-02 NOTE — Progress Notes (Signed)
    Procedures performed today:    None.  Independent interpretation of notes and tests performed by another provider:   None.  Brief History, Exam, Impression, and Recommendations:    Right lumbar radiculopathy Breanna Gomez returns, known lumbar DDD with color contact of the right L2-L3 disc in the right L2 nerve root, she historically did really well with Celebrex twice a day, and never needed epidurals with Dr. Laurian Brim. Refilling medication, return as needed.  Primary osteoarthritis of left shoulder Stable with Celebrex twice a day, refilling. Glenohumeral injection can be used if needed in the future.    ____________________________________________ Ihor Austin. Benjamin Stain, M.D., ABFM., CAQSM., AME. Primary Care and Sports Medicine [place] [facility]  Adjunct Professor of Family Medicine  [place] [employer]  Restaurant manager, fast food

## 2022-04-14 ENCOUNTER — Other Ambulatory Visit: Payer: Self-pay | Admitting: Family Medicine

## 2022-04-14 DIAGNOSIS — F411 Generalized anxiety disorder: Secondary | ICD-10-CM

## 2022-04-17 ENCOUNTER — Ambulatory Visit (INDEPENDENT_AMBULATORY_CARE_PROVIDER_SITE_OTHER): Payer: 59 | Admitting: Family Medicine

## 2022-04-17 ENCOUNTER — Other Ambulatory Visit (HOSPITAL_COMMUNITY)
Admission: RE | Admit: 2022-04-17 | Discharge: 2022-04-17 | Disposition: A | Discharge status: Home / Self Care | Payer: 59 | Source: Ambulatory Visit | Attending: Family Medicine | Admitting: Family Medicine

## 2022-04-17 VITALS — BP 116/61 | HR 76 | Ht 66.0 in | Wt 142.0 lb

## 2022-04-17 DIAGNOSIS — Z23 Encounter for immunization: Secondary | ICD-10-CM

## 2022-04-17 DIAGNOSIS — J329 Chronic sinusitis, unspecified: Secondary | ICD-10-CM

## 2022-04-17 DIAGNOSIS — J40 Bronchitis, not specified as acute or chronic: Secondary | ICD-10-CM

## 2022-04-17 DIAGNOSIS — Z124 Encounter for screening for malignant neoplasm of cervix: Secondary | ICD-10-CM | POA: Diagnosis present

## 2022-04-17 DIAGNOSIS — Z0000 Encounter for general adult medical examination without abnormal findings: Secondary | ICD-10-CM

## 2022-04-17 DIAGNOSIS — J9801 Acute bronchospasm: Secondary | ICD-10-CM

## 2022-04-17 MED ORDER — LEVALBUTEROL TARTRATE 45 MCG/ACT IN AERO: 2.0000 | INHALATION_SPRAY | Freq: Four times a day (QID) | RESPIRATORY_TRACT | 6 refills | Status: DC | PRN

## 2022-04-17 NOTE — Progress Notes (Signed)
Complete physical exam  Patient: Breanna Gomez   DOB: 1960-07-09   61 y.o. Female  MRN: [id number]  Subjective:    Chief Complaint  Patient presents with   Annual Exam    Breanna Gomez Mclin is a 61 y.o. female who presents today for a complete physical exam. She reports consuming a general diet.  She does not have additional problems to discuss today.    Most recent fall risk assessment:    03/20/2022    8:13 AM  Fall Risk   Falls in the past year? 0  Number falls in past yr: 0  Injury with Fall? 0  Risk for fall due to : No Fall Risks  Follow up Falls evaluation completed     Most recent depression screenings:    03/20/2022    8:13 AM 10/10/2021    9:06 AM  PHQ 2/9 Scores  PHQ - 2 Score 4 3  PHQ- 9 Score  13        Patient Care Team: Agapito Games, MD as PCP - General   Outpatient Medications Prior to Visit  Medication Sig   Ascorbic Acid (VITAMIN C) 1000 MG tablet Take 1,000 mg by mouth daily.   atorvastatin (LIPITOR) 40 MG tablet Take 1 tablet (40 mg total) by mouth daily.   celecoxib (CELEBREX) 200 MG capsule 1-2 caps p.o. daily   Cholecalciferol (VITAMIN D3 PO) Take by mouth.   citalopram (CELEXA) 20 MG tablet Take 1 tablet (20 mg total) by mouth daily.   clonazePAM (KLONOPIN) 0.5 MG tablet Take 0.5-1 tablets (0.25-0.5 mg total) by mouth daily as needed for anxiety.   losartan-hydrochlorothiazide (HYZAAR) 100-12.5 MG tablet Take 1 tablet by mouth daily.   melatonin 3 MG TABS tablet Take 3 mg by mouth at bedtime as needed.   Multiple Vitamins-Minerals (ZINC PO) Take by mouth.   RESTASIS 0.05 % ophthalmic emulsion 1 drop 2 (two) times daily.   tretinoin (RETIN-A) 0.05 % cream Apply topically at bedtime.   [DISCONTINUED] fluticasone-salmeterol (ADVAIR) 100-50 MCG/ACT AEPB Inhale 1 puff into the lungs 2 (two) times daily.   [DISCONTINUED] levalbuterol (XOPENEX HFA) 45 MCG/ACT inhaler Inhale 2 puffs into the lungs every 6 (six) hours as needed for  wheezing.   [DISCONTINUED] chlorpheniramine-HYDROcodone (TUSSIONEX) 10-8 MG/5ML Take 5 mLs by mouth every 12 (twelve) hours as needed for cough (cough, will cause drowsiness.).   No facility-administered medications prior to visit.    ROS        Objective:     BP 116/61   Pulse 76   Ht 5\' 6"  (1.676 m)   Wt 142 lb (64.4 kg)   LMP 05/12/2000   SpO2 97%   BMI 22.92 kg/m    Physical Exam Vitals and nursing note reviewed. Exam conducted with a chaperone present.  Constitutional:      Appearance: She is well-developed.  HENT:     Head: Normocephalic and atraumatic.     Right Ear: Tympanic membrane, ear canal and external ear normal.     Left Ear: Tympanic membrane, ear canal and external ear normal.     Nose: Nose normal.     Mouth/Throat:     Pharynx: Oropharynx is clear.  Eyes:     Conjunctiva/sclera: Conjunctivae normal.     Pupils: Pupils are equal, round, and reactive to light.  Neck:     Thyroid: No thyromegaly.  Cardiovascular:     Rate and Rhythm: Normal rate and regular rhythm.  Heart sounds: Normal heart sounds.  Pulmonary:     Effort: Pulmonary effort is normal.     Breath sounds: Normal breath sounds. No wheezing.  Abdominal:     General: Bowel sounds are normal.     Palpations: Abdomen is soft.  Genitourinary:    General: Normal vulva.     Labia:        Right: No rash.        Left: No rash.      Vagina: Normal.     Cervix: Normal.     Uterus: Normal.      Adnexa: Right adnexa normal.  Musculoskeletal:     Cervical back: Neck supple.  Lymphadenopathy:     Cervical: No cervical adenopathy.  Skin:    General: Skin is warm and dry.  Neurological:     Mental Status: She is alert and oriented to person, place, and time.  Psychiatric:        Behavior: Behavior normal.      No results found for any visits on 04/17/22.     Assessment & Plan:    Routine Health Maintenance and Physical Exam  Immunization History  Administered Date(s)  Administered   Influenza Split 02/20/2012   Influenza,inj,Quad PF,6+ Mos 02/10/2015, 01/07/2016, 02/04/2017, 02/10/2018, 02/16/2019, 04/10/2020, 04/11/2021, 04/17/2022   Influenza-Unspecified 02/17/2014   Janssen (J&J) SARS-COV-2 Vaccination 10/25/2019   Td 09/14/2008   Tdap 02/16/2019   Zoster Recombinat (Shingrix) 04/10/2020, 11/22/2020    Health Maintenance  Topic Date Due   Pneumococcal Vaccine 47-31 Years old (1 - PCV) Never done   Lung Cancer Screening  Never done   PAP SMEAR-Modifier  02/04/2022   COVID-19 Vaccine (2 - 2023-24 season) 04/18/2023 (Originally 01/10/2022)   MAMMOGRAM  12/21/2023   COLONOSCOPY (Pts 45-71yrs Insurance coverage will need to be confirmed)  08/21/2026   DTaP/Tdap/Td (3 - Td or Tdap) 02/15/2029   INFLUENZA VACCINE  Completed   Hepatitis C Screening  Completed   HIV Screening  Completed   Zoster Vaccines- Shingrix  Completed   HPV VACCINES  Aged Out    Discussed health benefits of physical activity, and encouraged her to engage in regular exercise appropriate for her age and condition.  Problem List Items Addressed This Visit       Respiratory   RESOLVED: Sinobronchitis     Other   Bronchospasm   Relevant Medications   levalbuterol (XOPENEX HFA) 45 MCG/ACT inhaler   Other Visit Diagnoses     Wellness examination    -  Primary   Relevant Orders   COMPLETE METABOLIC PANEL WITH GFR   Hemoglobin A1c   Screening for cervical cancer       Relevant Orders   Cytology - PAP   Need for immunization against influenza       Relevant Orders   Flu Vaccine QUAD 11mo+IM (Fluarix, Fluzone & Alfiuria Quad PF) (Completed)       Keep up a regular exercise program and make sure you are eating a healthy diet Try to eat 4 servings of dairy a day, or if you are lactose intolerant take a calcium with vitamin D daily.  Your vaccines are up to date.   Flu vaccine given Today. Due for pneumonia - she will get it next time.   Return in about 6 months  (around 10/17/2022) for Hypertension.     Nani Gasser, MD

## 2022-04-18 LAB — COMPLETE METABOLIC PANEL WITH GFR
AG Ratio: 1.7 (calc) (ref 1.0–2.5)
ALT: 12 U/L (ref 6–29)
AST: 14 U/L (ref 10–35)
Albumin: 4.3 g/dL (ref 3.6–5.1)
Alkaline phosphatase (APISO): 71 U/L (ref 37–153)
BUN: 13 mg/dL (ref 7–25)
CO2: 29 mmol/L (ref 20–32)
Calcium: 10.4 mg/dL (ref 8.6–10.4)
Chloride: 100 mmol/L (ref 98–110)
Creat: 0.75 mg/dL (ref 0.50–1.05)
Globulin: 2.5 g/dL (calc) (ref 1.9–3.7)
Glucose, Bld: 100 mg/dL — ABNORMAL HIGH (ref 65–99)
Potassium: 4.2 mmol/L (ref 3.5–5.3)
Sodium: 134 mmol/L — ABNORMAL LOW (ref 135–146)
Total Bilirubin: 0.7 mg/dL (ref 0.2–1.2)
Total Protein: 6.8 g/dL (ref 6.1–8.1)
eGFR: 89 mL/min/{1.73_m2} (ref >=60)

## 2022-04-18 LAB — HEMOGLOBIN A1C
Hgb A1c MFr Bld: 6.2 % of total Hgb — ABNORMAL HIGH (ref <5.7)
Mean Plasma Glucose: 131 mg/dL
eAG (mmol/L): 7.3 mmol/L

## 2022-04-18 NOTE — Progress Notes (Signed)
HI Toshia, your A1c went up to 6.2 this time still in the prediabetes range but it did jump up from 5.7.  Just encouraged her to really work on healthy diet and regular exercise to bring that back down.  Sodium is just borderline but stable.  Liver enzymes look normal.  Kidney function looks good.

## 2022-04-22 LAB — CYTOLOGY - PAP
Comment: NEGATIVE
Diagnosis: NEGATIVE
Diagnosis: REACTIVE
High risk HPV: NEGATIVE

## 2022-04-22 NOTE — Progress Notes (Signed)
Hi Aarti,  Pap smear is normal and HPV is negative so everything looks great!

## 2022-06-17 ENCOUNTER — Encounter: Payer: Self-pay | Admitting: Sports Medicine

## 2022-06-17 ENCOUNTER — Ambulatory Visit: Payer: 59 | Admitting: Sports Medicine

## 2022-06-17 VITALS — BP 130/79 | HR 84

## 2022-06-17 DIAGNOSIS — I739 Peripheral vascular disease, unspecified: Secondary | ICD-10-CM

## 2022-06-17 DIAGNOSIS — F17200 Nicotine dependence, unspecified, uncomplicated: Secondary | ICD-10-CM | POA: Diagnosis not present

## 2022-06-17 MED ORDER — ASPIRIN 81 MG PO TBEC: 81.0000 mg | DELAYED_RELEASE_TABLET | Freq: Every day | ORAL | 12 refills | Status: DC

## 2022-06-17 NOTE — Assessment & Plan Note (Signed)
Current pack-a-day smoker, she has been smoking since she was 62 years old. She has tried bupropion, Chantix, nicotine replacement all without improvement. I would like her to work aggressively with her PCP considering the current developments and concern for PAD. She does have approximately 51 pack years of smoking so we will order the low-dose lung cancer screening CT.

## 2022-06-17 NOTE — Assessment & Plan Note (Signed)
This is a very pleasant 62 year old female, she is a pack-a-day smoker, her husband had severe peripheral arterial disease, ended up with amputations. Unfortunately she has noted pain at the tip of her right third toe, there does appear to be a purpleish lesion. She does not have any claudication with walking. On exam she does have what appears to be a pre-ulceration right third toe, there is significant mottling both feet, I am unable to palpate a right dorsalis pedis pulse, her posterior tibial pulse on the right is very difficult to palpate. I can feel both on the left. On review of her x-rays of the lumbar spine she does have aortic and iliac calcifications. I do suspect we are dealing with peripheral arterial disease, I explained to her the anatomy and pathophysiology as well as the importance of smoking cessation. We will going get fasting labs tomorrow morning. We will going get ABIs, I would like to also start aspirin. Follow-up treatment will depend on ABIs however if we do notice an abnormal ABI or TBI we will consider aortobifemoral angiography with bilateral lower extremity runoff and referral to vascular surgery.

## 2022-06-17 NOTE — Progress Notes (Signed)
    Procedures performed today:    None.  Independent interpretation of notes and tests performed by another provider:   None.  Brief History, Exam, Impression, and Recommendations:    Peripheral arterial disease ([place]) This is a very pleasant 62 year old female, she is a pack-a-day smoker, her husband had severe peripheral arterial disease, ended up with amputations. Unfortunately she has noted pain at the tip of her right third toe, there does appear to be a purpleish lesion. She does not have any claudication with walking. On exam she does have what appears to be a pre-ulceration right third toe, there is significant mottling both feet, I am unable to palpate a right dorsalis pedis pulse, her posterior tibial pulse on the right is very difficult to palpate. I can feel both on the left. On review of her x-rays of the lumbar spine she does have aortic and iliac calcifications. I do suspect we are dealing with peripheral arterial disease, I explained to her the anatomy and pathophysiology as well as the importance of smoking cessation. We will going get fasting labs tomorrow morning. We will going get ABIs, I would like to also start aspirin. Follow-up treatment will depend on ABIs however if we do notice an abnormal ABI or TBI we will consider aortobifemoral angiography with bilateral lower extremity runoff and referral to vascular surgery.   Smoker Current pack-a-day smoker, she has been smoking since she was 62 years old. She has tried bupropion, Chantix, nicotine replacement all without improvement. I would like her to work aggressively with her PCP considering the current developments and concern for PAD. She does have approximately 51 pack years of smoking so we will order the low-dose lung cancer screening CT.  Chronic process with exacerbation and pharmacologic intervention  ____________________________________________ Gwen Her. Dianah Field, M.D., ABFM., CAQSM.,  AME. Primary Care and Sports Medicine [place] [facility]  Adjunct Professor of [place] [employer]  Risk manager

## 2022-06-19 LAB — COMPLETE METABOLIC PANEL WITH GFR
AG Ratio: 1.6 (calc) (ref 1.0–2.5)
ALT: 16 U/L (ref 6–29)
AST: 17 U/L (ref 10–35)
Albumin: 4.6 g/dL (ref 3.6–5.1)
Alkaline phosphatase (APISO): 74 U/L (ref 37–153)
BUN: 10 mg/dL (ref 7–25)
CO2: 30 mmol/L (ref 20–32)
Calcium: 10.7 mg/dL — ABNORMAL HIGH (ref 8.6–10.4)
Chloride: 100 mmol/L (ref 98–110)
Creat: 0.77 mg/dL (ref 0.50–1.05)
Globulin: 2.8 g/dL (calc) (ref 1.9–3.7)
Glucose, Bld: 102 mg/dL — ABNORMAL HIGH (ref 65–99)
Potassium: 4.9 mmol/L (ref 3.5–5.3)
Sodium: 136 mmol/L (ref 135–146)
Total Bilirubin: 0.8 mg/dL (ref 0.2–1.2)
Total Protein: 7.4 g/dL (ref 6.1–8.1)
eGFR: 87 mL/min/{1.73_m2} (ref >=60)

## 2022-06-19 LAB — HEMOGLOBIN A1C
Hgb A1c MFr Bld: 6 % of total Hgb — ABNORMAL HIGH (ref <5.7)
Mean Plasma Glucose: 126 mg/dL
eAG (mmol/L): 7 mmol/L

## 2022-06-19 LAB — CBC WITH DIFFERENTIAL/PLATELET
Absolute Monocytes: 378 cells/uL (ref 200–950)
Basophils Absolute: 68 cells/uL (ref 0–200)
Basophils Relative: 1.1 %
Eosinophils Absolute: 136 cells/uL (ref 15–500)
Eosinophils Relative: 2.2 %
HCT: 46.9 % — ABNORMAL HIGH (ref 35.0–45.0)
Hemoglobin: 15.9 g/dL — ABNORMAL HIGH (ref 11.7–15.5)
Lymphs Abs: 2027 cells/uL (ref 850–3900)
MCH: 30.9 pg (ref 27.0–33.0)
MCHC: 33.9 g/dL (ref 32.0–36.0)
MCV: 91.1 fL (ref 80.0–100.0)
MPV: 8.9 fL (ref 7.5–12.5)
Monocytes Relative: 6.1 %
Neutro Abs: 3590 cells/uL (ref 1500–7800)
Neutrophils Relative %: 57.9 %
Platelets: 388 10*3/uL (ref 140–400)
RBC: 5.15 10*6/uL — ABNORMAL HIGH (ref 3.80–5.10)
RDW: 11.8 % (ref 11.0–15.0)
Total Lymphocyte: 32.7 %
WBC: 6.2 10*3/uL (ref 3.8–10.8)

## 2022-06-19 LAB — LIPID PANEL
Cholesterol: 135 mg/dL (ref <200)
HDL: 54 mg/dL (ref >=50)
LDL Cholesterol (Calc): 62 mg/dL (calc)
Non-HDL Cholesterol (Calc): 81 mg/dL (calc) (ref <130)
Total CHOL/HDL Ratio: 2.5 (calc) (ref <5.0)
Triglycerides: 103 mg/dL (ref <150)

## 2022-06-19 LAB — TSH: TSH: 1.87 mIU/L (ref 0.40–4.50)

## 2022-07-03 ENCOUNTER — Encounter: Payer: Self-pay | Admitting: Sports Medicine

## 2022-07-03 NOTE — Telephone Encounter (Signed)
Rerouting to referral coordinator. Breanna Gomez

## 2022-07-03 NOTE — Telephone Encounter (Signed)
ABI ordered, please look into what is happening with that referral.  Thank you

## 2022-07-09 ENCOUNTER — Ambulatory Visit (INDEPENDENT_AMBULATORY_CARE_PROVIDER_SITE_OTHER): Payer: 59

## 2022-07-09 DIAGNOSIS — Z122 Encounter for screening for malignant neoplasm of respiratory organs: Secondary | ICD-10-CM | POA: Diagnosis not present

## 2022-07-09 DIAGNOSIS — Z720 Tobacco use: Secondary | ICD-10-CM

## 2022-07-11 ENCOUNTER — Other Ambulatory Visit: Payer: Self-pay | Admitting: Family Medicine

## 2022-07-16 ENCOUNTER — Other Ambulatory Visit: Payer: Self-pay | Admitting: Family Medicine

## 2022-07-16 DIAGNOSIS — F411 Generalized anxiety disorder: Secondary | ICD-10-CM

## 2022-08-19 ENCOUNTER — Other Ambulatory Visit: Payer: Self-pay | Admitting: Sports Medicine

## 2022-08-19 DIAGNOSIS — M19012 Primary osteoarthritis, left shoulder: Secondary | ICD-10-CM

## 2022-08-25 ENCOUNTER — Ambulatory Visit (INDEPENDENT_AMBULATORY_CARE_PROVIDER_SITE_OTHER): Payer: 59

## 2022-08-25 ENCOUNTER — Ambulatory Visit: Payer: 59 | Admitting: Sports Medicine

## 2022-08-25 ENCOUNTER — Encounter: Payer: Self-pay | Admitting: Sports Medicine

## 2022-08-25 ENCOUNTER — Other Ambulatory Visit: Payer: Self-pay | Admitting: Sports Medicine

## 2022-08-25 DIAGNOSIS — G8929 Other chronic pain: Secondary | ICD-10-CM

## 2022-08-25 DIAGNOSIS — M25511 Pain in right shoulder: Secondary | ICD-10-CM | POA: Diagnosis not present

## 2022-08-25 DIAGNOSIS — M25512 Pain in left shoulder: Secondary | ICD-10-CM

## 2022-08-25 DIAGNOSIS — M5416 Radiculopathy, lumbar region: Secondary | ICD-10-CM

## 2022-08-25 MED ORDER — PREDNISONE 50 MG PO TABS: ORAL_TABLET | ORAL | 0 refills | Status: DC

## 2022-08-25 MED ORDER — TRAMADOL HCL 50 MG PO TABS: 50.0000 mg | ORAL_TABLET | Freq: Three times a day (TID) | ORAL | 0 refills | Status: DC | PRN

## 2022-08-25 NOTE — Assessment & Plan Note (Signed)
She is having increasing back pain with radiation down the right leg into the buttock. Nothing past the knee, we have treated her in the past for lumbar spondylosis, she has a right L2-L3 disc herniation. She is taking Celebrex twice a day, needs a refill on tramadol. We will start conservative again, 5 days of prednisone, formal PT, refilling tramadol, return to see me in 6 weeks, MR for interventional planning if no better.

## 2022-08-25 NOTE — Progress Notes (Addendum)
    Procedures performed today:    None.  Independent interpretation of notes and tests performed by another provider:   None.  Brief History, Exam, Impression, and Recommendations:    Right lumbar radiculopathy She is having increasing back pain with radiation down the right leg into the buttock. Nothing past the knee, we have treated her in the past for lumbar spondylosis, she has a right L2-L3 disc herniation. She is taking Celebrex twice a day, needs a refill on tramadol. We will start conservative again, 5 days of prednisone, formal PT, refilling tramadol, return to see me in 6 weeks, MR for interventional planning if no better.  Bilateral shoulder pain Increasing pain left shoulder, positive impingement sign's on exam, adding x-rays, PT, return to see me in 6 weeks.  In imaging she reported pain with right shoulder, we will just image both.  I spent 30 minutes of total time managing this patient today, this includes chart review, face to face, and non-face to face time.  ____________________________________________ Ihor Austin. Benjamin Stain, M.D., ABFM., CAQSM., AME. Primary Care and Sports Medicine [facility]  Adjunct Professor of Family Medicine  [employer]  Restaurant manager, [employer] food

## 2022-08-25 NOTE — Addendum Note (Signed)
Addended by: Monica Becton on: 08/25/2022 03:16 PM   Modules accepted: Orders

## 2022-08-25 NOTE — Assessment & Plan Note (Addendum)
Increasing pain left shoulder, positive impingement sign's on exam, adding x-rays, PT, return to see me in 6 weeks.  In imaging she reported pain with right shoulder, we will just image both.

## 2022-09-03 ENCOUNTER — Ambulatory Visit: Payer: 59 | Attending: Sports Medicine | Admitting: Physical Therapy

## 2022-09-03 ENCOUNTER — Other Ambulatory Visit: Payer: Self-pay

## 2022-09-03 ENCOUNTER — Encounter: Payer: Self-pay | Admitting: Physical Therapy

## 2022-09-03 DIAGNOSIS — M25512 Pain in left shoulder: Secondary | ICD-10-CM | POA: Diagnosis present

## 2022-09-03 DIAGNOSIS — G8929 Other chronic pain: Secondary | ICD-10-CM

## 2022-09-03 DIAGNOSIS — M6281 Muscle weakness (generalized): Secondary | ICD-10-CM | POA: Diagnosis present

## 2022-09-03 DIAGNOSIS — M5416 Radiculopathy, lumbar region: Secondary | ICD-10-CM | POA: Diagnosis present

## 2022-09-03 NOTE — Therapy (Signed)
OUTPATIENT PHYSICAL THERAPY THORACOLUMBAR EVALUATION   Patient Name: STPEHANIE MONTROY MRN: [id number] DOB:[birth year], 62 y.o., female Today's Date: 09/03/2022  END OF SESSION:  PT End of Session - 09/03/22 1010     Visit Number 1    Number of Visits 12    Date for PT Re-Evaluation 10/15/22    Authorization Type UHC    PT Start Time 0925    PT Stop Time 1010    PT Time Calculation (min) 45 min    Activity Tolerance Patient tolerated treatment well    Behavior During Therapy WFL for tasks assessed/performed             Past Medical History:  Diagnosis Date   Goiter, euthyroid    Past Surgical History:  Procedure Laterality Date   BREAST SURGERY     cyst removes from r breast   removed cyst from tailbone     removed cyst    TONSILLECTOMY  62 yrs old   Patient Active Problem List   Diagnosis Date Noted   Bilateral shoulder pain 08/25/2022   Bronchospasm 04/17/2022   Attention deficit hyperactivity disorder (ADHD), combined type 10/10/2021   Syrinx of spinal cord 11/29/2018   Radiculitis of left cervical region 11/05/2018   Primary osteoarthritis of left shoulder 10/08/2018   Right lumbar radiculopathy 12/11/2016   HYPERCALCEMIA 05/10/2010   Smoker 01/25/2007   ANXIETY DISORDER, GENERALIZED 11/19/2006   MICROSCOPIC HEMATURIA 08/28/2006   MAMMOGRAM, ABNORMAL 08/28/2006   Goiter 07/29/2006   HYPERTENSION, BENIGN ESSENTIAL 07/29/2006   POSTMENOPAUSAL STATUS 07/29/2006   INSULIN RESISTANCE SYNDROME 06/29/2006   Hyperlipidemia 06/29/2006    PCP: Linford Arnold  REFERRING PROVIDER: Benjamin Stain  REFERRING DIAG: Rt lumbar radiculopathy  Rationale for Evaluation and Treatment: Rehabilitation  THERAPY DIAG:  Radiculopathy, lumbar region  Muscle weakness (generalized)  Chronic left shoulder pain  ONSET DATE: 02/2022  SUBJECTIVE:                                                                                                                                                                                            SUBJECTIVE STATEMENT: Starting last fall pt states she has been having back pain that is traveling down Rt hip into posterior thigh and occasionally into Lt hip/glute. Symptoms are almost constant, minor relief with meds. Pain increases with increased walking, prolonged sitting. She has also been having Lt shoulder pain since she tried lifting weights a few years ago. She states increased pain with lifting and states she has difficulty getting shirts on. She states she has occasional muscle spasms in Lt shoulder  PERTINENT HISTORY:  2018 pt had sciatica which  resolved with PT and meds  PAIN:  Are you having pain? Yes: NPRS scale: 4/10 currently Rt low back, glutes - 7/10 at worst/10 Pain location: Rt low back, glutes Pain description: sharp, sore Aggravating factors: prolonged walking, sitting Relieving factors: meds  PRECAUTIONS: None  WEIGHT BEARING RESTRICTIONS: No  FALLS:  Has patient fallen in last 6 months? No   OCCUPATION: office/desk work  PLOF: Independent  PATIENT GOALS: be able to return to walking, decrease pain  NEXT MD VISIT: 5/28  OBJECTIVE:   DIAGNOSTIC FINDINGS:  Lt shoulder x ray: Glenohumeral degenerative changes.   Lumbar x ray: degenerative changes  PATIENT SURVEYS:  FOTO 49      POSTURE: rounded shoulders and forward head  PALPATION: TTP Rt glutes, piriformis, Rt SIJ UPAs  LUMBAR ROM:   AROM eval  Flexion WFL  Extension Limited 50% -pain  Right lateral flexion WFL  Left lateral flexion WFL  Right rotation Limited 25% pain  Left rotation WFL   (Blank rows = not tested)    LOWER EXTREMITY MMT:    MMT Right eval Left eval  Hip flexion 4- 4  Hip extension 3+ 4-  Hip abduction 4-   Hip adduction    Hip internal rotation    Hip external rotation    Knee flexion    Knee extension    Ankle dorsiflexion    Ankle plantarflexion    Ankle inversion    Ankle eversion     (Blank rows  = not tested) UPPER EXTREMITY MMT:  MMT Right eval Left eval  Shoulder flexion 4 4  Shoulder extension    Shoulder abduction 4 4  Shoulder adduction    Shoulder extension    Shoulder internal rotation    Shoulder external rotation    Middle trapezius    Lower trapezius    Elbow flexion    Elbow extension    Wrist flexion    Wrist extension    Wrist ulnar deviation    Wrist radial deviation    Wrist pronation    Wrist supination    Grip strength     (Blank rows = not tested)  UPPER EXTREMITY ROM:  Active ROM Right eval Left eval  Shoulder flexion  144  Shoulder extension    Shoulder abduction  116  Shoulder adduction    Shoulder extension    Shoulder internal rotation  38  Shoulder external rotation  70  Elbow flexion    Elbow extension    Wrist flexion    Wrist extension    Wrist ulnar deviation    Wrist radial deviation    Wrist pronation    Wrist supination     (Blank rows = not tested)  LUMBAR SPECIAL TESTS:  Straight leg raise test: PositiveRight   TODAY'S TREATMENT:  DATE: 09/02/21 See HEP    PATIENT EDUCATION:  Education details: PT POC and goals, HEP Person educated: Patient Education method: Explanation, Demonstration, and Handouts Education comprehension: verbalized understanding and returned demonstration  HOME EXERCISE PROGRAM: Access Code: [email or web address] URL: [email or web address] Date: 09/03/2022 Prepared by: Reggy Eye  Exercises - Seated Figure 4 Piriformis Stretch  - 1 x daily - 7 x weekly - 1 sets - 3 reps - 20-30 seconds hold - Plank with Hands on Table  - 1 x daily - 7 x weekly - 1 sets - 10 reps - 20-30 seconds hold - Standing Bilateral Low Shoulder Row with Anchored Resistance  - 1 x daily - 7 x weekly - 3 sets - 10 reps - Shoulder Extension with Resistance  - 1 x daily - 7 x weekly - 3  sets - 10 reps - Standing Glute Med Mobilization with Small Ball on Wall  - 1 x daily - 7 x weekly - 1 sets - 1 reps - 1 minute hold  ASSESSMENT:  CLINICAL IMPRESSION: Patient is a 62 y.o. female who was seen today for physical therapy evaluation and treatment for lumbar radiculopathy and Lt shoulder pain. She presents with decreased core and UE strength, decreased functional activity tolerance, increased pain, impaired posture. Pt will benefit from skilled PT services to address deficits and improve functional mobility and activity tolerance.   OBJECTIVE IMPAIRMENTS: decreased activity tolerance, difficulty walking, decreased ROM, decreased strength, increased muscle spasms, and pain.   ACTIVITY LIMITATIONS: lifting, sitting, transfers, dressing, and locomotion level  PARTICIPATION LIMITATIONS: cleaning, community activity, occupation, and yard work  PERSONAL FACTORS: Past/current experiences and Time since onset of injury/illness/exacerbation are also affecting patient's functional outcome.   REHAB POTENTIAL: Good  CLINICAL DECISION MAKING: Evolving/moderate complexity  EVALUATION COMPLEXITY: Moderate   GOALS: Goals reviewed with patient? Yes  SHORT TERM GOALS: Target date: 09/17/2022    Pt will be independent with initial HEP Baseline: Goal status: INITIAL  LONG TERM GOALS: Target date: 10/15/2022   Pt will be independent with advanced HEP Baseline:  Goal status: INITIAL  2.  Pt will improve FOTO to >= 60 to demo improved functional mobility Baseline:  Goal status: INITIAL  3.  Pt will tolerate walking x 10 minutes with pain <= 2/10 to return to fitness routine Baseline:  Goal status: INITIAL  4.  Pt will improve Lt UE strength to 4+/5 to improve tolerance to ADLs and IADLs Baseline:  Goal status: INITIAL  5.  Pt will improve bilat LE strength to 4+/5 to improve standing and walking tolerance Baseline:  Goal status: INITIAL   PLAN:  PT FREQUENCY:  2x/week  PT DURATION: 6 weeks  PLANNED INTERVENTIONS: Therapeutic exercises, Therapeutic activity, Neuromuscular re-education, Balance training, Gait training, Patient/Family education, Self Care, Joint mobilization, Aquatic Therapy, Dry Needling, Electrical stimulation, Cryotherapy, Moist heat, Taping, Traction, Ultrasound, Ionotophoresis /ml Dexamethasone, Manual therapy, and Re-evaluation.  PLAN FOR NEXT SESSION: assess response to HEP, core strength, Lt shoulder ROM   Jeniece Hannis, PT 09/03/2022, 10:11 AM

## 2022-09-05 ENCOUNTER — Ambulatory Visit: Payer: 59 | Admitting: Physical Therapy

## 2022-09-05 ENCOUNTER — Encounter: Payer: Self-pay | Admitting: Physical Therapy

## 2022-09-05 DIAGNOSIS — G8929 Other chronic pain: Secondary | ICD-10-CM

## 2022-09-05 DIAGNOSIS — M5416 Radiculopathy, lumbar region: Secondary | ICD-10-CM | POA: Diagnosis not present

## 2022-09-05 DIAGNOSIS — M6281 Muscle weakness (generalized): Secondary | ICD-10-CM

## 2022-09-05 NOTE — Therapy (Signed)
OUTPATIENT PHYSICAL THERAPY    Patient Name: Breanna Gomez MRN: [id number] DOB:09-17-1960, 62 y.o., female Today's Date: 09/05/2022  END OF SESSION:  PT End of Session - 09/05/22 1132     Visit Number 2    Number of Visits 12    Date for PT Re-Evaluation 10/15/22    Authorization Type UHC    Authorization - Visit Number 2    Authorization - Number of Visits 20    PT Start Time 1050    PT Stop Time 1131    PT Time Calculation (min) 41 min    Activity Tolerance Patient tolerated treatment well    Behavior During Therapy WFL for tasks assessed/performed              Past Medical History:  Diagnosis Date   Goiter, euthyroid    Past Surgical History:  Procedure Laterality Date   BREAST SURGERY     cyst removes from r breast   removed cyst from tailbone     removed cyst    TONSILLECTOMY  62 yrs old   Patient Active Problem List   Diagnosis Date Noted   Bilateral shoulder pain 08/25/2022   Bronchospasm 04/17/2022   Attention deficit hyperactivity disorder (ADHD), combined type 10/10/2021   Syrinx of spinal cord (HCC) 11/29/2018   Radiculitis of left cervical region 11/05/2018   Primary osteoarthritis of left shoulder 10/08/2018   Right lumbar radiculopathy 12/11/2016   HYPERCALCEMIA 05/10/2010   Smoker 01/25/2007   ANXIETY DISORDER, GENERALIZED 11/19/2006   MICROSCOPIC HEMATURIA 08/28/2006   MAMMOGRAM, ABNORMAL 08/28/2006   Goiter 07/29/2006   HYPERTENSION, BENIGN ESSENTIAL 07/29/2006   POSTMENOPAUSAL STATUS 07/29/2006   INSULIN RESISTANCE SYNDROME 06/29/2006   Hyperlipidemia 06/29/2006    PCP: Linford Arnold  REFERRING PROVIDER: Benjamin Stain  REFERRING DIAG: Rt lumbar radiculopathy  Rationale for Evaluation and Treatment: Rehabilitation  THERAPY DIAG:  Radiculopathy, lumbar region  Muscle weakness (generalized)  Chronic left shoulder pain  ONSET DATE: 02/2022  SUBJECTIVE:                                                                                                                                                                                            SUBJECTIVE STATEMENT:  Pt states she feels her shoulder felt better with the exercises. Her buttocks/back pain continues to be constant, no relief from stretches or exercises  PERTINENT HISTORY:  2018 pt had sciatica which resolved with PT and meds  Starting last fall pt states she has been having back pain that is traveling down Rt hip into posterior thigh and occasionally into Lt hip/glute. Symptoms are almost constant, minor relief with  meds. Pain increases with increased walking, prolonged sitting. She has also been having Lt shoulder pain since she tried lifting weights a few years ago. She states increased pain with lifting and states she has difficulty getting shirts on. She states she has occasional muscle spasms in Lt shoulder  PAIN:  Are you having pain? Yes: NPRS scale: 4/10 currently Rt low back, glutes - 7/10 at worst/10 Pain location: Rt low back, glutes Pain description: sharp, sore Aggravating factors: prolonged walking, sitting Relieving factors: meds  PRECAUTIONS: None  WEIGHT BEARING RESTRICTIONS: No  FALLS:  Has patient fallen in last 6 months? No   OCCUPATION: office/desk work  PLOF: Independent  PATIENT GOALS: be able to return to walking, decrease pain  NEXT MD VISIT: 5/28  OBJECTIVE:    LUMBAR ROM:   AROM eval  Flexion WFL  Extension Limited 50% -pain  Right lateral flexion WFL  Left lateral flexion WFL  Right rotation Limited 25% pain  Left rotation WFL   (Blank rows = not tested)    LOWER EXTREMITY MMT:    MMT Right eval Left eval  Hip flexion 4- 4  Hip extension 3+ 4-  Hip abduction 4-   Hip adduction    Hip internal rotation    Hip external rotation    Knee flexion    Knee extension    Ankle dorsiflexion    Ankle plantarflexion    Ankle inversion    Ankle eversion     (Blank rows = not tested) UPPER EXTREMITY  MMT:  MMT Right eval Left eval  Shoulder flexion 4 4  Shoulder extension    Shoulder abduction 4 4  Shoulder adduction    Shoulder extension    Shoulder internal rotation    Shoulder external rotation    Middle trapezius    Lower trapezius    Elbow flexion    Elbow extension    Wrist flexion    Wrist extension    Wrist ulnar deviation    Wrist radial deviation    Wrist pronation    Wrist supination    Grip strength     (Blank rows = not tested)  UPPER EXTREMITY ROM:  Active ROM Right eval Left eval  Shoulder flexion  144  Shoulder extension    Shoulder abduction  116  Shoulder adduction    Shoulder extension    Shoulder internal rotation  38  Shoulder external rotation  70  Elbow flexion    Elbow extension    Wrist flexion    Wrist extension    Wrist ulnar deviation    Wrist radial deviation    Wrist pronation    Wrist supination     (Blank rows = not tested)  LUMBAR SPECIAL TESTS:  Straight leg raise test: PositiveRight   TODAY'S TREATMENT:  [facility] Adult PT Treatment:                                                DATE: 09/05/22 Therapeutic Exercise: Nustep L6 x 5 min for warm up Seated figure 4 stretch 2 x 30 sec Row green TB x 20 Shoulder ext TB x 20 Pallof green TB x 10 bilat Chops green TB x 10 bilat Sit <> stand 10# KB x 10 Sidelying clam x 15 SKTC 2 x 30 sec Supine piriformis stretch 2 x 30 sec LTR 10 x 10 sec bilat Manual Therapy: STM and TPR Rt glutes and piriformis, gentle SIJ UPAs grade 1-2   DATE: 09/02/21 See HEP    PATIENT EDUCATION:  Education details: PT POC and goals, HEP Person educated: Patient Education method: Explanation, Demonstration, and Handouts Education comprehension: verbalized understanding and returned demonstration  HOME EXERCISE PROGRAM: Access Code: [email or web address] URL:  [email or web address] Date: 09/03/2022 Prepared by: Reggy Eye  Exercises - Seated Figure 4 Piriformis Stretch  - 1 x daily - 7 x weekly - 1 sets - 3 reps - 20-30 seconds hold - Plank with Hands on Table  - 1 x daily - 7 x weekly - 1 sets - 10 reps - 20-30 seconds hold - Standing Bilateral Low Shoulder Row with Anchored Resistance  - 1 x daily - 7 x weekly - 3 sets - 10 reps - Shoulder Extension with Resistance  - 1 x daily - 7 x weekly - 3 sets - 10 reps - Standing Glute Med Mobilization with Small Ball on Wall  - 1 x daily - 7 x weekly - 1 sets - 1 reps - 1 minute hold  ASSESSMENT:  CLINICAL IMPRESSION: Pt with good tolerance to progression of exercise. Educated pt on modifying range and duration of activities to reduce pain and improve tolerance. Pt states she feels better at end of session     GOALS: Goals reviewed with patient? Yes  SHORT TERM GOALS: Target date: 09/17/2022    Pt will be independent with initial HEP Baseline: Goal status: INITIAL  LONG TERM GOALS: Target date: 10/15/2022   Pt will be independent with advanced HEP Baseline:  Goal status: INITIAL  2.  Pt will improve FOTO to >= 60 to demo improved functional mobility Baseline:  Goal status: INITIAL  3.  Pt will tolerate walking x 10 minutes with pain <= 2/10 to return to fitness routine Baseline:  Goal status: INITIAL  4.  Pt will improve Lt UE strength to 4+/5 to improve tolerance to ADLs and IADLs Baseline:  Goal status: INITIAL  5.  Pt will improve bilat LE strength to 4+/5 to improve standing and walking tolerance Baseline:  Goal status: INITIAL   PLAN:  PT FREQUENCY: 2x/week  PT DURATION: 6 weeks  PLANNED INTERVENTIONS: Therapeutic exercises, Therapeutic activity, Neuromuscular re-education, Balance training, Gait training, Patient/Family education, Self Care, Joint mobilization, Aquatic Therapy, Dry Needling, Electrical stimulation, Cryotherapy, Moist heat, Taping,  Traction, Ultrasound, Ionotophoresis 4mg /ml Dexamethasone, Manual therapy, and Re-evaluation.  PLAN FOR NEXT SESSION: core strength, Lt shoulder ROM   Trang Bouse, PT 09/05/2022, 11:33 AM

## 2022-09-09 ENCOUNTER — Ambulatory Visit: Payer: 59

## 2022-09-09 DIAGNOSIS — G8929 Other chronic pain: Secondary | ICD-10-CM

## 2022-09-09 DIAGNOSIS — M5416 Radiculopathy, lumbar region: Secondary | ICD-10-CM | POA: Diagnosis not present

## 2022-09-09 DIAGNOSIS — M6281 Muscle weakness (generalized): Secondary | ICD-10-CM

## 2022-09-09 NOTE — Therapy (Signed)
OUTPATIENT PHYSICAL THERAPY TREATMENT   Patient Name: Breanna Gomez MRN: [id number] DOB:Dec 09, 1960, 63 y.o., female Today's Date: 09/09/2022  END OF SESSION:  PT End of Session - 09/09/22 0846     Visit Number 3    Number of Visits 12    Date for PT Re-Evaluation 10/15/22    Authorization Type [employer]    Authorization - Visit Number 3    Authorization - Number of Visits 20    PT Start Time 0846    PT Stop Time 0928    PT Time Calculation (min) 42 min    Activity Tolerance Patient tolerated treatment well    Behavior During Therapy WFL for tasks assessed/performed              Past Medical History:  Diagnosis Date   Goiter, euthyroid    Past Surgical History:  Procedure Laterality Date   BREAST SURGERY     cyst removes from r breast   removed cyst from tailbone     removed cyst    TONSILLECTOMY  62 yrs old   Patient Active Problem List   Diagnosis Date Noted   Bilateral shoulder pain 08/25/2022   Bronchospasm 04/17/2022   Attention deficit hyperactivity disorder (ADHD), combined type 10/10/2021   Syrinx of spinal cord (HCC) 11/29/2018   Radiculitis of left cervical region 11/05/2018   Primary osteoarthritis of left shoulder 10/08/2018   Right lumbar radiculopathy 12/11/2016   HYPERCALCEMIA 05/10/2010   Smoker 01/25/2007   ANXIETY DISORDER, GENERALIZED 11/19/2006   MICROSCOPIC HEMATURIA 08/28/2006   MAMMOGRAM, ABNORMAL 08/28/2006   Goiter 07/29/2006   HYPERTENSION, BENIGN ESSENTIAL 07/29/2006   POSTMENOPAUSAL STATUS 07/29/2006   INSULIN RESISTANCE SYNDROME 06/29/2006   Hyperlipidemia 06/29/2006    PCP: Linford Arnold  REFERRING PROVIDER: Benjamin Stain  REFERRING DIAG: Rt lumbar radiculopathy  Rationale for Evaluation and Treatment: Rehabilitation  THERAPY DIAG:  Radiculopathy, lumbar region  Muscle weakness (generalized)  Chronic left shoulder pain  ONSET DATE: 02/2022  SUBJECTIVE:                                                                                                                                                                                            SUBJECTIVE STATEMENT: Patient reports her shoulder is feeling "looser", states her R hip pain is 6/10 today and it radiates down leg.  PERTINENT HISTORY:  2018 pt had sciatica which resolved with PT and meds  Starting last fall pt states she has been having back pain that is traveling down Rt hip into posterior thigh and occasionally into Lt hip/glute. Symptoms are almost constant, minor relief with meds. Pain increases with increased  walking, prolonged sitting. She has also been having Lt shoulder pain since she tried lifting weights a few years ago. She states increased pain with lifting and states she has difficulty getting shirts on. She states she has occasional muscle spasms in Lt shoulder  PAIN:  Are you having pain? Yes: NPRS scale: 4/10 currently Rt low back, glutes - 7/10 at worst/10 Pain location: Rt low back, glutes Pain description: sharp, sore Aggravating factors: prolonged walking, sitting Relieving factors: meds  PRECAUTIONS: None  WEIGHT BEARING RESTRICTIONS: No  FALLS:  Has patient fallen in last 6 months? No   OCCUPATION: office/desk work  PLOF: Independent  PATIENT GOALS: be able to return to walking, decrease pain  NEXT MD VISIT: 5/28  OBJECTIVE:    LUMBAR ROM:   AROM eval  Flexion WFL  Extension Limited 50% -pain  Right lateral flexion WFL  Left lateral flexion WFL  Right rotation Limited 25% pain  Left rotation WFL   (Blank rows = not tested)    LOWER EXTREMITY MMT:    MMT Right eval Left eval  Hip flexion 4- 4  Hip extension 3+ 4-  Hip abduction 4-   Hip adduction    Hip internal rotation    Hip external rotation    Knee flexion    Knee extension    Ankle dorsiflexion    Ankle plantarflexion    Ankle inversion    Ankle eversion     (Blank rows = not tested) UPPER EXTREMITY MMT:  MMT Right eval Left eval   Shoulder flexion 4 4  Shoulder extension    Shoulder abduction 4 4  Shoulder adduction    Shoulder extension    Shoulder internal rotation    Shoulder external rotation    Middle trapezius    Lower trapezius    Elbow flexion    Elbow extension    Wrist flexion    Wrist extension    Wrist ulnar deviation    Wrist radial deviation    Wrist pronation    Wrist supination    Grip strength     (Blank rows = not tested)  UPPER EXTREMITY ROM:  Active ROM Right eval Left eval  Shoulder flexion  144  Shoulder extension    Shoulder abduction  116  Shoulder adduction    Shoulder extension    Shoulder internal rotation  38  Shoulder external rotation  70  Elbow flexion    Elbow extension    Wrist flexion    Wrist extension    Wrist ulnar deviation    Wrist radial deviation    Wrist pronation    Wrist supination     (Blank rows = not tested)  LUMBAR SPECIAL TESTS:  Straight leg raise test: PositiveRight   TODAY'S TREATMENT:        [facility] Adult PT Treatment:                                                DATE: 09/09/2022 Therapeutic Exercise: Supine sciatic nerve glide/dynamic HS strech (B) Seated figure 4 stretch (B) Seated HS stretch 3x30" (B) S/L hip abd+ext leg raises (R) S/L R ITB/TFL stretch Standing rows GTB x10 Doorway pec/bicep stretch (low) 2x30"  Resisted side stepping GTB --> fwd diagonal stepping GTB  [facility] Adult PT Treatment:                                                DATE: 09/05/22 Therapeutic Exercise: Nustep L6 x 5 min for warm up Seated figure 4 stretch 2 x 30 sec Row green TB x 20 Shoulder ext TB x 20 Pallof green TB x 10 bilat Chops green TB x 10 bilat Sit <> stand 10# KB x 10 Sidelying clam x 15 SKTC 2 x 30 sec Supine piriformis stretch 2 x 30 sec LTR 10 x 10 sec bilat Manual Therapy: STM and TPR Rt glutes and  piriformis, gentle SIJ UPAs grade 1-2   PATIENT EDUCATION:  Education details: PT POC and goals, HEP Person educated: Patient Education method: Explanation, Demonstration, and Handouts Education comprehension: verbalized understanding and returned demonstration  HOME EXERCISE PROGRAM: Access Code: [email or web address] URL: [email or web address] Date: 09/03/2022 Prepared by: Reggy Eye  Exercises - Seated Figure 4 Piriformis Stretch  - 1 x daily - 7 x weekly - 1 sets - 3 reps - 20-30 seconds hold - Plank with Hands on Table  - 1 x daily - 7 x weekly - 1 sets - 10 reps - 20-30 seconds hold - Standing Bilateral Low Shoulder Row with Anchored Resistance  - 1 x daily - 7 x weekly - 3 sets - 10 reps - Shoulder Extension with Resistance  - 1 x daily - 7 x weekly - 3 sets - 10 reps - Standing Glute Med Mobilization with Small Ball on Wall  - 1 x daily - 7 x weekly - 1 sets - 1 reps - 1 minute hold  ASSESSMENT:  CLINICAL IMPRESSION:  Resisted walking added to challenge hip and core stability and dynamic balance. Tactile cues provided to improve scapular mobility and decrease upper trapezius compensation and tension.    GOALS: Goals reviewed with patient? Yes  SHORT TERM GOALS: Target date: 09/17/2022  Pt will be independent with initial HEP Baseline: Goal status: INITIAL  LONG TERM GOALS: Target date: 10/15/2022  Pt will be independent with advanced HEP Baseline:  Goal status: INITIAL  2.  Pt will improve FOTO to >= 60 to demo improved functional mobility Baseline:  Goal status: INITIAL  3.  Pt will tolerate walking x 10 minutes with pain <= 2/10 to return to fitness routine Baseline:  Goal status: INITIAL  4.  Pt will improve Lt UE strength to 4+/5 to improve tolerance to ADLs and IADLs Baseline:  Goal status: INITIAL  5.  Pt will improve bilat LE strength to 4+/5 to improve standing and walking tolerance Baseline:  Goal status: INITIAL   PLAN:  PT FREQUENCY:  2x/week  PT DURATION: 6 weeks  PLANNED INTERVENTIONS: Therapeutic exercises, Therapeutic activity, Neuromuscular re-education, Balance training, Gait training, Patient/Family education, Self Care, Joint mobilization, Aquatic Therapy, Dry Needling, Electrical stimulation, Cryotherapy, Moist heat, Taping, Traction, Ultrasound, Ionotophoresis 4mg /ml Dexamethasone, Manual therapy, and Re-evaluation.  PLAN FOR NEXT SESSION: Progress core & hip/glute strength, Lt shoulder ROM   Sanjuana Mae, PTA 09/09/2022, 9:29 AM

## 2022-09-11 ENCOUNTER — Ambulatory Visit: Payer: 59 | Attending: Sports Medicine

## 2022-09-11 DIAGNOSIS — G8929 Other chronic pain: Secondary | ICD-10-CM | POA: Insufficient documentation

## 2022-09-11 DIAGNOSIS — M5416 Radiculopathy, lumbar region: Secondary | ICD-10-CM

## 2022-09-11 DIAGNOSIS — M6281 Muscle weakness (generalized): Secondary | ICD-10-CM | POA: Insufficient documentation

## 2022-09-11 DIAGNOSIS — M25512 Pain in left shoulder: Secondary | ICD-10-CM | POA: Diagnosis present

## 2022-09-11 NOTE — Therapy (Signed)
09/11/2022, 2:47 PM OUTPATIENT PHYSICAL THERAPY TREATMENT   Patient Name: Breanna Gomez MRN: [id number] DOB:[birth year], 62 y.o., female Today's Date: 09/11/2022  END OF SESSION:  PT End of Session - 09/11/22 1401     Visit Number 4    Number of Visits 12    Date for PT Re-Evaluation 10/15/22    Authorization Type [employer]    Authorization - Visit Number 4    Authorization - Number of Visits 20    PT Start Time 1400    PT Stop Time 1442    PT Time Calculation (min) 42 min    Activity Tolerance Patient tolerated treatment well    Behavior During Therapy WFL for tasks assessed/performed              Past Medical History:  Diagnosis Date   Goiter, euthyroid    Past Surgical History:  Procedure Laterality Date   BREAST SURGERY     cyst removes from r breast   removed cyst from tailbone     removed cyst    TONSILLECTOMY  62 yrs old   Patient Active Problem List   Diagnosis Date Noted   Bilateral shoulder pain 08/25/2022   Bronchospasm 04/17/2022   Attention deficit hyperactivity disorder (ADHD), combined type 10/10/2021   Syrinx of spinal cord (HCC) 11/29/2018   Radiculitis of left cervical region 11/05/2018   Primary osteoarthritis of left shoulder 10/08/2018   Right lumbar radiculopathy 12/11/2016   HYPERCALCEMIA 05/10/2010   Smoker 01/25/2007   ANXIETY DISORDER, GENERALIZED 11/19/2006   MICROSCOPIC HEMATURIA 08/28/2006   MAMMOGRAM, ABNORMAL 08/28/2006   Goiter 07/29/2006   HYPERTENSION, BENIGN ESSENTIAL 07/29/2006   POSTMENOPAUSAL STATUS 07/29/2006   INSULIN RESISTANCE SYNDROME 06/29/2006   Hyperlipidemia 06/29/2006    PCP: Linford Arnold  REFERRING PROVIDER: Benjamin Stain  REFERRING DIAG: Rt lumbar radiculopathy  Rationale for Evaluation and Treatment: Rehabilitation  THERAPY DIAG:  Radiculopathy, lumbar region  Muscle weakness (generalized)  Chronic left shoulder pain  ONSET DATE: 02/2022  SUBJECTIVE:                                                                                                                                                                                            SUBJECTIVE STATEMENT: Patient reports 4/10 pain, primarily in her lateral shin.  PERTINENT HISTORY:  2018 pt had sciatica which resolved with PT and meds  Starting last fall pt states she has been having back pain that is traveling down Rt hip into posterior thigh and occasionally into Lt hip/glute. Symptoms are almost constant, minor relief with meds. Pain increases with increased walking, prolonged sitting. She has also been having  Lt shoulder pain since she tried lifting weights a few years ago. She states increased pain with lifting and states she has difficulty getting shirts on. She states she has occasional muscle spasms in Lt shoulder  PAIN:  Are you having pain? Yes: NPRS scale: 4/10 currently Rt low back, glutes - 7/10 at worst/10 Pain location: Rt low back, glutes Pain description: sharp, sore Aggravating factors: prolonged walking, sitting Relieving factors: meds  PRECAUTIONS: None  WEIGHT BEARING RESTRICTIONS: No  FALLS:  Has patient fallen in last 6 months? No   OCCUPATION: office/desk work  PLOF: Independent  PATIENT GOALS: be able to return to walking, decrease pain  NEXT MD VISIT: 5/28  OBJECTIVE:    LUMBAR ROM:   AROM eval  Flexion WFL  Extension Limited 50% -pain  Right lateral flexion WFL  Left lateral flexion WFL  Right rotation Limited 25% pain  Left rotation WFL   (Blank rows = not tested)    LOWER EXTREMITY MMT:    MMT Right eval Left eval  Hip flexion 4- 4  Hip extension 3+ 4-  Hip abduction 4-   Hip adduction    Hip internal rotation    Hip external rotation    Knee flexion    Knee extension    Ankle dorsiflexion    Ankle plantarflexion    Ankle inversion    Ankle eversion     (Blank rows = not tested) UPPER EXTREMITY MMT:  MMT Right eval Left eval  Shoulder flexion 4 4  Shoulder extension     Shoulder abduction 4 4  Shoulder adduction    Shoulder extension    Shoulder internal rotation    Shoulder external rotation    Middle trapezius    Lower trapezius    Elbow flexion    Elbow extension    Wrist flexion    Wrist extension    Wrist ulnar deviation    Wrist radial deviation    Wrist pronation    Wrist supination    Grip strength     (Blank rows = not tested)  UPPER EXTREMITY ROM:  Active ROM Right eval Left eval  Shoulder flexion  144  Shoulder extension    Shoulder abduction  116  Shoulder adduction    Shoulder extension    Shoulder internal rotation  38  Shoulder external rotation  70  Elbow flexion    Elbow extension    Wrist flexion    Wrist extension    Wrist ulnar deviation    Wrist radial deviation    Wrist pronation    Wrist supination     (Blank rows = not tested)  LUMBAR SPECIAL TESTS:  Straight leg raise test: PositiveRight   TODAY'S TREATMENT:         [facility] Adult PT Treatment:                                                DATE: 09/11/2022 Therapeutic Exercise: Supine sciatic nerve glide: ankle pumps --> ankle circles Modified piriformis stretch S/L (propped on green bolster) clamshells RTB & reverse clamshells RTB Bent knee fall outs GTB Bridges + gentle ball squeeze x10 Prone hip extension x10 B Quadruped <--> child's pose Bird dog progression    [facility] Adult PT Treatment:  DATE: 09/09/2022 Therapeutic Exercise: Supine sciatic nerve glide/dynamic HS strech (B) Seated figure 4 stretch (B) Seated HS stretch 3x30" (B) S/L hip abd+ext leg raises (R) S/L R ITB/TFL stretch Standing rows GTB x10 Doorway pec/bicep stretch (low) 2x30"  Resisted side stepping GTB --> fwd diagonal stepping GTB                                                                                                                         [facility] Adult PT Treatment:                                                DATE:  09/05/22 Therapeutic Exercise: Nustep L6 x 5 min for warm up Seated figure 4 stretch 2 x 30 sec Row green TB x 20 Shoulder ext TB x 20 Pallof green TB x 10 bilat Chops green TB x 10 bilat Sit <> stand 10# KB x 10 Sidelying clam x 15 SKTC 2 x 30 sec Supine piriformis stretch 2 x 30 sec LTR 10 x 10 sec bilat Manual Therapy: STM and TPR Rt glutes and piriformis, gentle SIJ UPAs grade 1-2   PATIENT EDUCATION:  Education details: PT POC and goals, HEP Person educated: Patient Education method: Explanation, Demonstration, and Handouts Education comprehension: verbalized understanding and returned demonstration  HOME EXERCISE PROGRAM: Access Code: [email or web address] URL: [email or web address] Date: 09/11/2022 Prepared by: Carlynn Herald  Exercises - Seated Figure 4 Piriformis Stretch  - 1 x daily - 7 x weekly - 1 sets - 3 reps - 20-30 seconds hold - Plank with Hands on Table  - 1 x daily - 7 x weekly - 1 sets - 10 reps - 20-30 seconds hold - Standing Bilateral Low Shoulder Row with Anchored Resistance  - 1 x daily - 7 x weekly - 3 sets - 10 reps - Shoulder Extension with Resistance  - 1 x daily - 7 x weekly - 3 sets - 10 reps - Standing Glute Med Mobilization with Small Ball on Wall  - 1 x daily - 7 x weekly - 1 sets - 1 reps - 1 minute hold - Supine Piriformis Stretch with Leg Straight  - 1 x daily - 7 x weekly - 1 sets - 3 reps - 1 min hold - Supine Bridge  - 1 x daily - 7 x weekly - 3 sets - 10 reps - Sidelying Reverse Clamshell with Resistance  - 1 x daily - 7 x weekly - 3 sets - 10 reps - Bent Knee Fallouts  - 1 x daily - 7 x weekly - 3 sets - 10 reps - Child's Pose Stretch  - 1 x daily - 7 x weekly - 3 sets - 10 reps  ASSESSMENT:  CLINICAL IMPRESSION:  Cueing improved TA activation and core stabilization and decreased lumbar discomfort. Pelvic instability noted during prone  hip extension. Patient responded well to modified piriformis stretch, reporting significant  decrease in glute/leg pain.   GOALS: Goals reviewed with patient? Yes  SHORT TERM GOALS: Target date: 09/17/2022  Pt will be independent with initial HEP Baseline: Goal status: INITIAL  LONG TERM GOALS: Target date: 10/15/2022  Pt will be independent with advanced HEP Baseline:  Goal status: INITIAL  2.  Pt will improve FOTO to >= 60 to demo improved functional mobility Baseline:  Goal status: INITIAL  3.  Pt will tolerate walking x 10 minutes with pain <= 2/10 to return to fitness routine Baseline:  Goal status: INITIAL  4.  Pt will improve Lt UE strength to 4+/5 to improve tolerance to ADLs and IADLs Baseline:  Goal status: INITIAL  5.  Pt will improve bilat LE strength to 4+/5 to improve standing and walking tolerance Baseline:  Goal status: INITIAL   PLAN:  PT FREQUENCY: 2x/week  PT DURATION: 6 weeks  PLANNED INTERVENTIONS: Therapeutic exercises, Therapeutic activity, Neuromuscular re-education, Balance training, Gait training, Patient/Family education, Self Care, Joint mobilization, Aquatic Therapy, Dry Needling, Electrical stimulation, Cryotherapy, Moist heat, Taping, Traction, Ultrasound, Ionotophoresis 4mg /ml Dexamethasone, Manual therapy, and Re-evaluation.  PLAN FOR NEXT SESSION: Progress core & hip/glute strength, Lt shoulder ROM   Sanjuana Mae, PTA 09/11/2022, 2:47 PM

## 2022-09-12 ENCOUNTER — Encounter: Payer: 59 | Admitting: Physical Therapy

## 2022-09-17 ENCOUNTER — Encounter: Payer: Self-pay | Admitting: Physical Therapy

## 2022-09-17 ENCOUNTER — Ambulatory Visit: Payer: 59 | Admitting: Physical Therapy

## 2022-09-17 DIAGNOSIS — G8929 Other chronic pain: Secondary | ICD-10-CM

## 2022-09-17 DIAGNOSIS — M5416 Radiculopathy, lumbar region: Secondary | ICD-10-CM

## 2022-09-17 DIAGNOSIS — M6281 Muscle weakness (generalized): Secondary | ICD-10-CM

## 2022-09-17 NOTE — Therapy (Signed)
09/17/2022, 10:07 AM OUTPATIENT PHYSICAL THERAPY TREATMENT   Patient Name: Breanna Gomez MRN: [id number] DOB:1960/08/23, 62 y.o., female Today's Date: 09/17/2022  END OF SESSION:  PT End of Session - 09/17/22 1006     Visit Number 5    Number of Visits 12    Date for PT Re-Evaluation 10/15/22    Authorization Type [employer]    Authorization - Visit Number 5    Authorization - Number of Visits 20    PT Start Time 0925    PT Stop Time 1007    PT Time Calculation (min) 42 min    Activity Tolerance Patient tolerated treatment well    Behavior During Therapy WFL for tasks assessed/performed               Past Medical History:  Diagnosis Date   Goiter, euthyroid    Past Surgical History:  Procedure Laterality Date   BREAST SURGERY     cyst removes from r breast   removed cyst from tailbone     removed cyst    TONSILLECTOMY  62 yrs old   Patient Active Problem List   Diagnosis Date Noted   Bilateral shoulder pain 08/25/2022   Bronchospasm 04/17/2022   Attention deficit hyperactivity disorder (ADHD), combined type 10/10/2021   Syrinx of spinal cord (HCC) 11/29/2018   Radiculitis of left cervical region 11/05/2018   Primary osteoarthritis of left shoulder 10/08/2018   Right lumbar radiculopathy 12/11/2016   HYPERCALCEMIA 05/10/2010   Smoker 01/25/2007   ANXIETY DISORDER, GENERALIZED 11/19/2006   MICROSCOPIC HEMATURIA 08/28/2006   MAMMOGRAM, ABNORMAL 08/28/2006   Goiter 07/29/2006   HYPERTENSION, BENIGN ESSENTIAL 07/29/2006   POSTMENOPAUSAL STATUS 07/29/2006   INSULIN RESISTANCE SYNDROME 06/29/2006   Hyperlipidemia 06/29/2006    PCP: Linford Arnold  REFERRING PROVIDER: Benjamin Stain  REFERRING DIAG: Rt lumbar radiculopathy  Rationale for Evaluation and Treatment: Rehabilitation  THERAPY DIAG:  Radiculopathy, lumbar region  Muscle weakness (generalized)  Chronic left shoulder pain  ONSET DATE: 02/2022  SUBJECTIVE:                                                                                                                                                                                            SUBJECTIVE STATEMENT: Patient reports she was very sore after last session. Mostly her Rt hip and down Rt LE. She states she feels her shoulder "locks up" sometimes but that her Rt LE is the biggest problem  PERTINENT HISTORY:  2018 pt had sciatica which resolved with PT and meds  Starting last fall pt states she has been having back pain that is traveling down Rt hip into  posterior thigh and occasionally into Lt hip/glute. Symptoms are almost constant, minor relief with meds. Pain increases with increased walking, prolonged sitting. She has also been having Lt shoulder pain since she tried lifting weights a few years ago. She states increased pain with lifting and states she has difficulty getting shirts on. She states she has occasional muscle spasms in Lt shoulder  PAIN:  Are you having pain? Yes: NPRS scale: 4/10 currently Rt low back, glutes - 7/10 at worst/10 Pain location: Rt low back, glutes Pain description: sharp, sore Aggravating factors: prolonged walking, sitting Relieving factors: meds  PRECAUTIONS: None  WEIGHT BEARING RESTRICTIONS: No  FALLS:  Has patient fallen in last 6 months? No   OCCUPATION: office/desk work  PLOF: Independent  PATIENT GOALS: be able to return to walking, decrease pain  NEXT MD VISIT: 5/28  OBJECTIVE:    LUMBAR ROM:   AROM eval  Flexion WFL  Extension Limited 50% -pain  Right lateral flexion WFL  Left lateral flexion WFL  Right rotation Limited 25% pain  Left rotation WFL   (Blank rows = not tested)    LOWER EXTREMITY MMT:    MMT Right eval Left eval  Hip flexion 4- 4  Hip extension 3+ 4-  Hip abduction 4-   Hip adduction    Hip internal rotation    Hip external rotation    Knee flexion    Knee extension    Ankle dorsiflexion    Ankle plantarflexion    Ankle inversion    Ankle  eversion     (Blank rows = not tested) UPPER EXTREMITY MMT:  MMT Right eval Left eval  Shoulder flexion 4 4  Shoulder extension    Shoulder abduction 4 4  Shoulder adduction    Shoulder extension    Shoulder internal rotation    Shoulder external rotation    Middle trapezius    Lower trapezius    Elbow flexion    Elbow extension    Wrist flexion    Wrist extension    Wrist ulnar deviation    Wrist radial deviation    Wrist pronation    Wrist supination    Grip strength     (Blank rows = not tested)  UPPER EXTREMITY ROM:  Active ROM Right eval Left eval  Shoulder flexion  144  Shoulder extension    Shoulder abduction  116  Shoulder adduction    Shoulder extension    Shoulder internal rotation  38  Shoulder external rotation  70  Elbow flexion    Elbow extension    Wrist flexion    Wrist extension    Wrist ulnar deviation    Wrist radial deviation    Wrist pronation    Wrist supination     (Blank rows = not tested)  LUMBAR SPECIAL TESTS:  Straight leg raise test: PositiveRight   TODAY'S TREATMENT:        [facility] Adult PT Treatment:                                                DATE: 09/17/22 Therapeutic Exercise: Nustep L6 x 5 min for warm up Quadruped to childs pose 3 x 10 sec Quadruped alt LE lift x 10 Seated figure 4 stretch 2 x 30 sec bilat Shoulder flexion wall slide 3 x 20 sec Isometric row step back  blue TB x 10 Isometric shoulder ext step back blue TB x 10 Antirotation side step green TB x 10 bilat Trunk diagonals yellow med ball x 10 bilat Trunk rotation ball toss to rebounder yellow med ball x 10 bilat Manual Therapy: Grade 2-3 jt mobs to Lt shoulder STM Lt shoulder musculature   [facility] Adult PT Treatment:                                                DATE: 09/11/2022 Therapeutic Exercise: Supine sciatic nerve glide: ankle pumps --> ankle circles Modified piriformis stretch S/L (propped on green bolster) clamshells RTB & reverse clamshells  RTB Bent knee fall outs GTB Bridges + gentle ball squeeze x10 Prone hip extension x10 B Quadruped <--> child's pose Bird dog progression    [facility] Adult PT Treatment:                                                DATE: 09/09/2022 Therapeutic Exercise: Supine sciatic nerve glide/dynamic HS strech (B) Seated figure 4 stretch (B) Seated HS stretch 3x30" (B) S/L hip abd+ext leg raises (R) S/L R ITB/TFL stretch Standing rows GTB x10 Doorway pec/bicep stretch (low) 2x30"  Resisted side stepping GTB --> fwd diagonal stepping GTB   PATIENT EDUCATION:  Education details: PT POC and goals, HEP Person educated: Patient Education method: Programmer, multimedia, Facilities manager, and Handouts Education comprehension: verbalized understanding and returned demonstration  HOME EXERCISE PROGRAM: Access Code: [email or web address] URL: [email or web address] Date: 09/11/2022 Prepared by: Carlynn Herald  Exercises - Seated Figure 4 Piriformis Stretch  - 1 x daily - 7 x weekly - 1 sets - 3 reps - 20-30 seconds hold - Plank with Hands on Table  - 1 x daily - 7 x weekly - 1 sets - 10 reps - 20-30 seconds hold - Standing Bilateral Low Shoulder Row with Anchored Resistance  - 1 x daily - 7 x weekly - 3 sets - 10 reps - Shoulder Extension with Resistance  - 1 x daily - 7 x weekly - 3 sets - 10 reps - Standing Glute Med Mobilization with Small Ball on Wall  - 1 x daily - 7 x weekly - 1 sets - 1 reps - 1 minute hold - Supine Piriformis Stretch with Leg Straight  - 1 x daily - 7 x weekly - 1 sets - 3 reps - 1 min hold - Supine Bridge  - 1 x daily - 7 x weekly - 3 sets - 10 reps - Sidelying Reverse Clamshell with Resistance  - 1 x daily - 7 x weekly - 3 sets - 10 reps - Bent Knee Fallouts  - 1 x daily - 7 x weekly - 3 sets - 10 reps - Child's Pose Stretch  - 1 x daily - 7 x weekly - 3 sets - 10 reps  ASSESSMENT:  CLINICAL IMPRESSION: Treatment focused on Lt UE ROM and strength and low back/Rt LE strength and  stability. Pt with good tolerance during session to all interventions. States she felt better after manual work to LT shoulder   GOALS: Goals reviewed with patient? Yes  SHORT TERM GOALS: Target date: 09/17/2022  Pt will be independent with initial HEP Baseline: Goal status: MET  LONG TERM GOALS: Target date: 10/15/2022  Pt will be independent with advanced HEP Baseline:  Goal status: INITIAL  2.  Pt will improve FOTO to >= 60 to demo improved functional mobility Baseline:  Goal status: INITIAL  3.  Pt will tolerate walking x 10 minutes with pain <= 2/10 to return to fitness routine Baseline:  Goal status: INITIAL  4.  Pt will improve Lt UE strength to 4+/5 to improve tolerance to ADLs and IADLs Baseline:  Goal status: INITIAL  5.  Pt will improve bilat LE strength to 4+/5 to improve standing and walking tolerance Baseline:  Goal status: INITIAL   PLAN:  PT FREQUENCY: 2x/week  PT DURATION: 6 weeks  PLANNED INTERVENTIONS: Therapeutic exercises, Therapeutic activity, Neuromuscular re-education, Balance training, Gait training, Patient/Family education, Self Care, Joint mobilization, Aquatic Therapy, Dry Needling, Electrical stimulation, Cryotherapy, Moist heat, Taping, Traction, Ultrasound, Ionotophoresis 4mg /ml Dexamethasone, Manual therapy, and Re-evaluation.  PLAN FOR NEXT SESSION: Progress core & hip/glute strength, Lt shoulder ROM   Shlomo Seres, PT 09/17/2022, 10:07 AM

## 2022-09-19 ENCOUNTER — Encounter: Payer: Self-pay | Admitting: Physical Therapy

## 2022-09-19 ENCOUNTER — Ambulatory Visit: Payer: 59 | Admitting: Physical Therapy

## 2022-09-19 DIAGNOSIS — M5416 Radiculopathy, lumbar region: Secondary | ICD-10-CM | POA: Diagnosis not present

## 2022-09-19 DIAGNOSIS — M6281 Muscle weakness (generalized): Secondary | ICD-10-CM

## 2022-09-19 DIAGNOSIS — G8929 Other chronic pain: Secondary | ICD-10-CM

## 2022-09-19 NOTE — Therapy (Addendum)
09/19/2022, 9:23 AM OUTPATIENT PHYSICAL THERAPY TREATMENT AND DISCHARGE   Patient Name: Breanna Gomez MRN: [id number] DOB:[birth year], 62 y.o., female Today's Date: 09/19/2022  END OF SESSION:  PT End of Session - 09/19/22 0922     Visit Number 6    Number of Visits 12    Date for PT Re-Evaluation 10/15/22    Authorization - Visit Number 6    Authorization - Number of Visits 20    PT Start Time 0836    PT Stop Time 0919    PT Time Calculation (min) 43 min    Activity Tolerance Patient tolerated treatment well    Behavior During Therapy WFL for tasks assessed/performed                Past Medical History:  Diagnosis Date   Goiter, euthyroid    Past Surgical History:  Procedure Laterality Date   BREAST SURGERY     cyst removes from r breast   removed cyst from tailbone     removed cyst    TONSILLECTOMY  62 yrs old   Patient Active Problem List   Diagnosis Date Noted   Bilateral shoulder pain 08/25/2022   Bronchospasm 04/17/2022   Attention deficit hyperactivity disorder (ADHD), combined type 10/10/2021   Syrinx of spinal cord (HCC) 11/29/2018   Radiculitis of left cervical region 11/05/2018   Primary osteoarthritis of left shoulder 10/08/2018   Right lumbar radiculopathy 12/11/2016   HYPERCALCEMIA 05/10/2010   Smoker 01/25/2007   ANXIETY DISORDER, GENERALIZED 11/19/2006   MICROSCOPIC HEMATURIA 08/28/2006   MAMMOGRAM, ABNORMAL 08/28/2006   Goiter 07/29/2006   HYPERTENSION, BENIGN ESSENTIAL 07/29/2006   POSTMENOPAUSAL STATUS 07/29/2006   INSULIN RESISTANCE SYNDROME 06/29/2006   Hyperlipidemia 06/29/2006    PCP: Linford Arnold  REFERRING PROVIDER: Benjamin Stain  REFERRING DIAG: Rt lumbar radiculopathy  Rationale for Evaluation and Treatment: Rehabilitation  THERAPY DIAG:  Radiculopathy, lumbar region  Muscle weakness (generalized)  Chronic left shoulder pain  ONSET DATE: 02/2022  SUBJECTIVE:                                                                                                                                                                                            SUBJECTIVE STATEMENT: Pt states her arm has "been bothering me some" and that her back "is not too severe". She states she is slowly improving  PERTINENT HISTORY:  2018 pt had sciatica which resolved with PT and meds  Starting last fall pt states she has been having back pain that is traveling down Rt hip into posterior thigh and occasionally into Lt hip/glute. Symptoms are almost constant, minor relief with meds.  Pain increases with increased walking, prolonged sitting. She has also been having Lt shoulder pain since she tried lifting weights a few years ago. She states increased pain with lifting and states she has difficulty getting shirts on. She states she has occasional muscle spasms in Lt shoulder  PAIN:  Are you having pain? Yes: NPRS scale: 3/10 currently Rt low back, glutes - 7/10 at worst/10 Pain location: Rt low back, glutes Pain description: sharp, sore Aggravating factors: prolonged walking, sitting Relieving factors: meds  PRECAUTIONS: None  WEIGHT BEARING RESTRICTIONS: No  FALLS:  Has patient fallen in last 6 months? No   OCCUPATION: office/desk work  PLOF: Independent  PATIENT GOALS: be able to return to walking, decrease pain  NEXT MD VISIT: 5/28  OBJECTIVE:    LUMBAR ROM:   AROM eval  Flexion WFL  Extension Limited 50% -pain  Right lateral flexion WFL  Left lateral flexion WFL  Right rotation Limited 25% pain  Left rotation WFL   (Blank rows = not tested)    LOWER EXTREMITY MMT:    MMT Right eval Left eval  Hip flexion 4- 4  Hip extension 3+ 4-  Hip abduction 4-   Hip adduction    Hip internal rotation    Hip external rotation    Knee flexion    Knee extension    Ankle dorsiflexion    Ankle plantarflexion    Ankle inversion    Ankle eversion     (Blank rows = not tested) UPPER EXTREMITY MMT:  MMT  Right eval Left eval  Shoulder flexion 4 4  Shoulder extension    Shoulder abduction 4 4  Shoulder adduction    Shoulder extension    Shoulder internal rotation    Shoulder external rotation    Middle trapezius    Lower trapezius    Elbow flexion    Elbow extension    Wrist flexion    Wrist extension    Wrist ulnar deviation    Wrist radial deviation    Wrist pronation    Wrist supination    Grip strength     (Blank rows = not tested)  UPPER EXTREMITY ROM:  Active ROM Right eval Left eval  Shoulder flexion  144  Shoulder extension    Shoulder abduction  116  Shoulder adduction    Shoulder extension    Shoulder internal rotation  38  Shoulder external rotation  70  Elbow flexion    Elbow extension    Wrist flexion    Wrist extension    Wrist ulnar deviation    Wrist radial deviation    Wrist pronation    Wrist supination     (Blank rows = not tested)  LUMBAR SPECIAL TESTS:  Straight leg raise test: PositiveRight   TODAY'S TREATMENT:        [facility] Adult PT Treatment:                                                DATE: 09/19/22 Therapeutic Exercise: Nustep L6 x 5 min for warm up Quadruped to childs pose 5 x 10 sec Quadruped alt LE lift x 10 Shoulder flexion wall slide 5 x 10 sec Isometric row step back blue TB x 10 Isometric shoulder ext blue TB x 10 Antirotation side step green TB x 10 bilat Trunk diagonals yellow med ball  x 10 bilat Trunk rotation ball toss to rebounder yellow med ball x 10 bilat Counter plank x 20 sec forward and side plank Manual Therapy: Grade 2-3 jt mobs to Lt shoulder STM Lt shoulder musculature   [facility] Adult PT Treatment:                                                DATE: 09/17/22 Therapeutic Exercise: Nustep L6 x 5 min for warm up Quadruped to childs pose 3 x 10 sec Quadruped alt LE lift x 10 Seated figure 4 stretch 2 x 30 sec bilat Shoulder flexion wall slide 3 x 20 sec Isometric row step back blue TB x 10 Isometric  shoulder ext step back blue TB x 10 Antirotation side step green TB x 10 bilat Trunk diagonals yellow med ball x 10 bilat Trunk rotation ball toss to rebounder yellow med ball x 10 bilat Manual Therapy: Grade 2-3 jt mobs to Lt shoulder STM Lt shoulder musculature   [facility] Adult PT Treatment:                                                DATE: 09/11/2022 Therapeutic Exercise: Supine sciatic nerve glide: ankle pumps --> ankle circles Modified piriformis stretch S/L (propped on green bolster) clamshells RTB & reverse clamshells RTB Bent knee fall outs GTB Bridges + gentle ball squeeze x10 Prone hip extension x10 B Quadruped <--> child's pose Bird dog progression     PATIENT EDUCATION:  Education details: PT POC and goals, HEP Person educated: Patient Education method: Programmer, multimedia, Facilities manager, and Handouts Education comprehension: verbalized understanding and returned demonstration  HOME EXERCISE PROGRAM: Access Code: [email or web address] URL: [email or web address] Date: 09/11/2022 Prepared by: Carlynn Herald  Exercises - Seated Figure 4 Piriformis Stretch  - 1 x daily - 7 x weekly - 1 sets - 3 reps - 20-30 seconds hold - Plank with Hands on Table  - 1 x daily - 7 x weekly - 1 sets - 10 reps - 20-30 seconds hold - Standing Bilateral Low Shoulder Row with Anchored Resistance  - 1 x daily - 7 x weekly - 3 sets - 10 reps - Shoulder Extension with Resistance  - 1 x daily - 7 x weekly - 3 sets - 10 reps - Standing Glute Med Mobilization with Small Ball on Wall  - 1 x daily - 7 x weekly - 1 sets - 1 reps - 1 minute hold - Supine Piriformis Stretch with Leg Straight  - 1 x daily - 7 x weekly - 1 sets - 3 reps - 1 min hold - Supine Bridge  - 1 x daily - 7 x weekly - 3 sets - 10 reps - Sidelying Reverse Clamshell with Resistance  - 1 x daily - 7 x weekly - 3 sets - 10 reps - Bent Knee Fallouts  - 1 x daily - 7 x weekly - 3 sets - 10 reps - Child's Pose Stretch  - 1 x daily - 7 x  weekly - 3 sets - 10 reps  ASSESSMENT:  CLINICAL IMPRESSION: Pt continues with notable mm spasticity in Lt triceps, some improvement with manual work. Pt continues with good tolerance to progression of core and UE strengthening  exercises. She plans to d/c after visits next week   GOALS: Goals reviewed with patient? Yes  SHORT TERM GOALS: Target date: 09/17/2022  Pt will be independent with initial HEP Baseline: Goal status: MET  LONG TERM GOALS: Target date: 10/15/2022  Pt will be independent with advanced HEP Baseline:  Goal status: INITIAL  2.  Pt will improve FOTO to >= 60 to demo improved functional mobility Baseline:  Goal status: INITIAL  3.  Pt will tolerate walking x 10 minutes with pain <= 2/10 to return to fitness routine Baseline:  Goal status: INITIAL  4.  Pt will improve Lt UE strength to 4+/5 to improve tolerance to ADLs and IADLs Baseline:  Goal status: INITIAL  5.  Pt will improve bilat LE strength to 4+/5 to improve standing and walking tolerance Baseline:  Goal status: INITIAL   PLAN:  PT FREQUENCY: 2x/week  PT DURATION: 6 weeks  PLANNED INTERVENTIONS: Therapeutic exercises, Therapeutic activity, Neuromuscular re-education, Balance training, Gait training, Patient/Family education, Self Care, Joint mobilization, Aquatic Therapy, Dry Needling, Electrical stimulation, Cryotherapy, Moist heat, Taping, Traction, Ultrasound, Ionotophoresis 4mg /ml Dexamethasone, Manual therapy, and Re-evaluation.  PLAN FOR NEXT SESSION: Finalize HEP for d/c after 5/16 appointment, Progress core & hip/glute strength, Lt shoulder ROM  PHYSICAL THERAPY DISCHARGE SUMMARY  Visits from Start of Care: 6  Current functional level related to goals / functional outcomes: Decreasing pain, improving strength   Remaining deficits: See above   Education / Equipment: HEP   Patient agrees to discharge. Patient goals were partially met. Patient is being discharged due to not  returning since the last visit. Reggy Eye, PT,DPT05/24/2410:02 AM   Karam Dunson, PT 09/19/2022, 9:23 AM

## 2022-09-24 ENCOUNTER — Ambulatory Visit: Payer: 59

## 2022-09-25 ENCOUNTER — Ambulatory Visit: Payer: 59

## 2022-10-07 ENCOUNTER — Ambulatory Visit: Payer: 59 | Admitting: Sports Medicine

## 2022-10-07 ENCOUNTER — Other Ambulatory Visit (INDEPENDENT_AMBULATORY_CARE_PROVIDER_SITE_OTHER): Payer: 59

## 2022-10-07 DIAGNOSIS — M5416 Radiculopathy, lumbar region: Secondary | ICD-10-CM

## 2022-10-07 DIAGNOSIS — M19012 Primary osteoarthritis, left shoulder: Secondary | ICD-10-CM

## 2022-10-07 MED ORDER — TRAMADOL HCL 50 MG PO TABS
50.0000 mg | ORAL_TABLET | Freq: Every day | ORAL | 3 refills | Status: DC | PRN
Start: 2022-10-07 — End: 2023-04-24

## 2022-10-07 MED ORDER — TRIAMCINOLONE ACETONIDE 40 MG/ML IJ SUSP
40.0000 mg | Freq: Once | INTRAMUSCULAR | Status: AC
Start: 2022-10-07 — End: 2022-10-07
  Administered 2022-10-07: 40 mg via INTRAMUSCULAR

## 2022-10-07 NOTE — Assessment & Plan Note (Signed)
Chronic left shoulder pain, she has been taking Celebrex without improvement, tramadol without improvement, x-rays showed glenohumeral osteoarthritis, injected the glenohumeral joint today with ultrasound guidance with 100% initial pain relief, return to see me as needed for this.

## 2022-10-07 NOTE — Assessment & Plan Note (Signed)
Persistent increasing back and buttock pain on the right with radiation to the lateral thigh, lateral lower leg into the arch consistent with an L5 radiculopathy. At this point she has failed Celebrex, tramadol, prednisone, formal physical therapy for greater than 6 weeks, proceed with MRI for epidural planning, likely right L5-S1 transforaminal. We will do a virtual or in person follow-up to go over the MRI images before ordering the injection.

## 2022-10-07 NOTE — Progress Notes (Signed)
    Procedures performed today:    Procedure: Real-time Ultrasound Guided injection of the left glenohumeral joint Device: Samsung [id number]  Verbal informed consent obtained.  Time-out conducted.  Noted no overlying erythema, induration, or other signs of local infection.  Skin prepped in a sterile fashion.  Local anesthesia: Topical Ethyl chloride.  With sterile technique and under real time ultrasound guidance: Noted arthritic joint, 1 cc Kenalog 40, 2 cc lidocaine, 2 cc bupivacaine injected easily Completed without difficulty  Advised to call if fevers/chills, erythema, induration, drainage, or persistent bleeding.  Images permanently stored and available for review in PACS.  Impression: Technically successful ultrasound guided injection.  Independent interpretation of notes and tests performed by another provider:   None.  Brief History, Exam, Impression, and Recommendations:    Primary osteoarthritis of left shoulder Chronic left shoulder pain, she has been taking Celebrex without improvement, tramadol without improvement, x-rays showed glenohumeral osteoarthritis, injected the glenohumeral joint today with ultrasound guidance with 100% initial pain relief, return to see me as needed for this.  Right lumbar radiculopathy Persistent increasing back and buttock pain on the right with radiation to the lateral thigh, lateral lower leg into the arch consistent with an L5 radiculopathy. At this point she has failed Celebrex, tramadol, prednisone, formal physical therapy for greater than 6 weeks, proceed with MRI for epidural planning, likely right L5-S1 transforaminal. We will do a virtual or in person follow-up to go over the MRI images before ordering the injection.    ____________________________________________ Ihor Austin. Benjamin Stain, M.D., ABFM., CAQSM., AME. Primary Care and Sports Medicine [facility]  Adjunct Professor of Family Medicine  [place] [employer]  [employer]  Restaurant manager, fast food

## 2022-10-10 ENCOUNTER — Other Ambulatory Visit: Payer: Self-pay | Admitting: Family Medicine

## 2022-10-10 DIAGNOSIS — E785 Hyperlipidemia, unspecified: Secondary | ICD-10-CM

## 2022-10-16 ENCOUNTER — Encounter: Payer: Self-pay | Admitting: Family Medicine

## 2022-10-16 ENCOUNTER — Ambulatory Visit: Payer: 59 | Admitting: Family Medicine

## 2022-10-16 VITALS — BP 101/55 | HR 75 | Ht 66.0 in | Wt 146.0 lb

## 2022-10-16 DIAGNOSIS — I10 Essential (primary) hypertension: Secondary | ICD-10-CM | POA: Diagnosis not present

## 2022-10-16 DIAGNOSIS — F411 Generalized anxiety disorder: Secondary | ICD-10-CM | POA: Diagnosis not present

## 2022-10-16 DIAGNOSIS — E348 Other specified endocrine disorders: Secondary | ICD-10-CM

## 2022-10-16 LAB — POCT GLYCOSYLATED HEMOGLOBIN (HGB A1C): Hemoglobin A1C: 5.6 % (ref 4.0–5.6)

## 2022-10-16 NOTE — Progress Notes (Signed)
Pressure low today.  Recommend to  Established Patient Office Visit  Subjective   Patient ID: Breanna Gomez, female    DOB: 04-24-1961  Age: 62 y.o. MRN: [id number]  Chief Complaint  Patient presents with   ifg   mood    HPI   Hypertension- Pt denies chest pain, SOB, dizziness, or heart palpitations.  Taking meds as directed w/o problems.  Denies medication side effects.    Impaired fasting glucose-no increased thirst or urination. No symptoms consistent with hypoglycemia.  Has been trying to eat better overall.  F/U GAD -he is only taking half a tab of the citalopram.  Occasionally she will take a whole tab 2 or 3 days in a row.  She has been hesitant to go up to a whole tab.  She is been worried about side effects.  She still dealing with some anxiety feelings but occasionally feels like she is a little flat as well.     ROS    Objective:     BP (!) 101/55   Pulse 75   Ht 5\' 6"  (1.676 m)   Wt 146 lb (66.2 kg)   LMP 05/12/2000   SpO2 97%   BMI 23.57 kg/m     Physical Exam Vitals and nursing note reviewed.  Constitutional:      Appearance: She is well-developed.  HENT:     Head: Normocephalic and atraumatic.  Cardiovascular:     Rate and Rhythm: Normal rate and regular rhythm.     Heart sounds: Normal heart sounds.  Pulmonary:     Effort: Pulmonary effort is normal.     Breath sounds: Normal breath sounds.  Skin:    General: Skin is warm and dry.  Neurological:     Mental Status: She is alert and oriented to person, place, and time.  Psychiatric:        Behavior: Behavior normal.      Results for orders placed or performed in visit on 10/16/22  POCT glycosylated hemoglobin (Hb A1C)  Result Value Ref Range   Hemoglobin A1C 5.6 4.0 - 5.6 %   HbA1c POC (<> result, manual entry)     HbA1c, POC (prediabetic range)     HbA1c, POC (controlled diabetic range)         The 10-year ASCVD risk score (Arnett DK, et al., 2019) is: 5.8%    Assessment &  Plan:   Problem List Items Addressed This Visit       Cardiovascular and Mediastinum   HYPERTENSION, BENIGN ESSENTIAL    Can pressures at home and let us know if they are low there as well or if it is just today.  We can always make an adjustment to her regimen if needed.        Endocrine   INSULIN RESISTANCE SYNDROME - Primary    1C looks great today at 5.6.  Continue to work on healthy food choices and regular exercise plan to recheck again in 6 months.  Lab Results  Component Value Date   HGBA1C 5.6 10/16/2022        Relevant Orders   POCT glycosylated hemoglobin (Hb A1C) (Completed)     Other   ANXIETY DISORDER, GENERALIZED    Happy with current regimen.  No changes made today.       Return in about 6 months (around 04/17/2023) for Hypertension, Pre-diabetes.    Nani Gasser, MD

## 2022-10-16 NOTE — Assessment & Plan Note (Signed)
Happy with current regimen.  No changes made today.

## 2022-10-16 NOTE — Patient Instructions (Signed)
Please check blood pressure at home a couple of times next week and the week after.  If the blood pressures are running a little low I might decrease her blood pressure pill.

## 2022-10-16 NOTE — Assessment & Plan Note (Signed)
Can pressures at home and let us know if they are low there as well or if it is just today.  We can always make an adjustment to her regimen if needed.

## 2022-10-16 NOTE — Assessment & Plan Note (Signed)
1C looks great today at 5.6.  Continue to work on healthy food choices and regular exercise plan to recheck again in 6 months.  Lab Results  Component Value Date   HGBA1C 5.6 10/16/2022

## 2022-10-19 ENCOUNTER — Ambulatory Visit (INDEPENDENT_AMBULATORY_CARE_PROVIDER_SITE_OTHER): Payer: 59

## 2022-10-19 DIAGNOSIS — M5416 Radiculopathy, lumbar region: Secondary | ICD-10-CM | POA: Diagnosis not present

## 2022-11-12 ENCOUNTER — Ambulatory Visit: Payer: 59 | Admitting: Sports Medicine

## 2022-11-12 DIAGNOSIS — M19012 Primary osteoarthritis, left shoulder: Secondary | ICD-10-CM

## 2022-11-12 DIAGNOSIS — M5416 Radiculopathy, lumbar region: Secondary | ICD-10-CM | POA: Diagnosis not present

## 2022-11-12 NOTE — Assessment & Plan Note (Signed)
Resolved after glenohumeral joint injection at the last visit.

## 2022-11-12 NOTE — Assessment & Plan Note (Signed)
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

## 2022-11-12 NOTE — Progress Notes (Signed)
    Procedures performed today:    None.  Independent interpretation of notes and tests performed by another provider:   None.  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  Primary osteoarthritis of left shoulder Resolved after glenohumeral joint injection at the last visit.    ____________________________________________ Ihor Austin. Benjamin Stain, M.D., ABFM., CAQSM., AME. Primary Care and Sports Medicine [facility]  Adjunct Professor of Family Medicine  [employer]  Restaurant manager, fast food

## 2022-12-17 ENCOUNTER — Encounter: Payer: Self-pay | Admitting: Sports Medicine

## 2022-12-17 DIAGNOSIS — M5416 Radiculopathy, lumbar region: Secondary | ICD-10-CM

## 2023-01-09 ENCOUNTER — Other Ambulatory Visit: Payer: Self-pay | Admitting: Family Medicine

## 2023-01-09 DIAGNOSIS — E785 Hyperlipidemia, unspecified: Secondary | ICD-10-CM

## 2023-01-14 ENCOUNTER — Other Ambulatory Visit: Payer: Self-pay | Admitting: Family Medicine

## 2023-01-14 DIAGNOSIS — F411 Generalized anxiety disorder: Secondary | ICD-10-CM

## 2023-04-22 ENCOUNTER — Ambulatory Visit (INDEPENDENT_AMBULATORY_CARE_PROVIDER_SITE_OTHER): Payer: 59 | Admitting: Family Medicine

## 2023-04-22 ENCOUNTER — Encounter: Payer: Self-pay | Admitting: Family Medicine

## 2023-04-22 VITALS — BP 113/57 | HR 74 | Ht 66.0 in | Wt 146.0 lb

## 2023-04-22 DIAGNOSIS — Z0000 Encounter for general adult medical examination without abnormal findings: Secondary | ICD-10-CM

## 2023-04-22 DIAGNOSIS — E348 Other specified endocrine disorders: Secondary | ICD-10-CM

## 2023-04-22 DIAGNOSIS — Z23 Encounter for immunization: Secondary | ICD-10-CM

## 2023-04-22 DIAGNOSIS — I10 Essential (primary) hypertension: Secondary | ICD-10-CM | POA: Diagnosis not present

## 2023-04-22 MED ORDER — LOSARTAN POTASSIUM-HCTZ 50-12.5 MG PO TABS: 1.0000 | ORAL_TABLET | Freq: Every day | ORAL | 3 refills | Status: DC

## 2023-04-22 NOTE — Progress Notes (Signed)
Complete physical exam  Patient: Breanna Gomez   DOB: 31-Aug-1960   62 y.o. Female  MRN: [id number]  Subjective:    Chief Complaint  Patient presents with   Annual Exam    Laporsha Hafele Gomez is a 62 y.o. female who presents today for a complete physical exam. She reports consuming a general diet. The patient does not participate in regular exercise at present. She generally feels fairly well. She reports sleeping fair, hard time fallling asleep. . She does not have additional problems to discuss today.    Most recent fall risk assessment:    04/22/2023   10:11 AM  Fall Risk   Falls in the past year? 0  Number falls in past yr: 0  Injury with Fall? 0  Risk for fall due to : No Fall Risks  Follow up Falls evaluation completed     Most recent depression screenings:    04/22/2023   10:11 AM 10/16/2022    9:13 AM  PHQ 2/9 Scores  PHQ - 2 Score 0 2  PHQ- 9 Score  15        Patient Care Team: Agapito Games, MD as PCP - General   Outpatient Medications Prior to Visit  Medication Sig   Ascorbic Acid (VITAMIN C) 1000 MG tablet Take 1,000 mg by mouth daily.   atorvastatin (LIPITOR) 40 MG tablet Take 1 tablet (40 mg total) by mouth daily.   celecoxib (CELEBREX) 200 MG capsule 1-2 caps p.o. daily   Cholecalciferol (VITAMIN D3 PO) Take by mouth.   citalopram (CELEXA) 20 MG tablet Take 1 tablet (20 mg total) by mouth daily.   clonazePAM (KLONOPIN) 0.5 MG tablet Take 1/2-1 tablet (0.25-0.5 mg total) by mouth daily as needed for anxiety.   levalbuterol (XOPENEX HFA) 45 MCG/ACT inhaler Inhale 2 puffs into the lungs every 6 (six) hours as needed for wheezing.   melatonin 3 MG TABS tablet Take 3 mg by mouth at bedtime as needed.   Multiple Vitamins-Minerals (ZINC PO) Take by mouth.   RESTASIS 0.05 % ophthalmic emulsion 1 drop 2 (two) times daily.   traMADol (ULTRAM) 50 MG tablet Take 1 tablet (50 mg total) by mouth daily as needed for moderate pain.   tretinoin (RETIN-A)  0.05 % cream Apply topically at bedtime.   [DISCONTINUED] losartan-hydrochlorothiazide (HYZAAR) 100-12.5 MG tablet Take 1 tablet by mouth daily.   No facility-administered medications prior to visit.    ROS        Objective:     BP (!) 113/57   Pulse 74   Ht 5\' 6"  (1.676 m)   Wt 146 lb (66.2 kg)   LMP 05/12/2000   SpO2 99%   BMI 23.57 kg/m     Physical Exam Constitutional:      Appearance: Normal appearance.  HENT:     Head: Normocephalic and atraumatic.     Right Ear: Tympanic membrane, ear canal and external ear normal.     Left Ear: Tympanic membrane, ear canal and external ear normal.     Nose: Nose normal.     Mouth/Throat:     Pharynx: Oropharynx is clear.  Eyes:     Extraocular Movements: Extraocular movements intact.     Conjunctiva/sclera: Conjunctivae normal.     Pupils: Pupils are equal, round, and reactive to light.  Neck:     Thyroid: No thyromegaly.  Cardiovascular:     Rate and Rhythm: Normal rate and regular rhythm.  Pulmonary:  Effort: Pulmonary effort is normal.     Breath sounds: Normal breath sounds.  Abdominal:     General: Bowel sounds are normal.     Palpations: Abdomen is soft.     Tenderness: There is no abdominal tenderness.  Musculoskeletal:        General: No swelling.     Cervical back: Neck supple.  Skin:    General: Skin is warm and dry.  Neurological:     Mental Status: She is oriented to person, place, and time.  Psychiatric:        Mood and Affect: Mood normal.        Behavior: Behavior normal.      No results found for any visits on 04/22/23.      Assessment & Plan:    Routine Health Maintenance and Physical Exam   Keep up a regular exercise program and make sure you are eating a healthy diet Try to eat 4 servings of dairy a day, or if you are lactose intolerant take a calcium with vitamin D daily.  Your vaccines are up to date.    Immunization History  Administered Date(s) Administered   Influenza  Split 02/20/2012   Influenza, Seasonal, Injecte, Preservative Fre 04/22/2023   Influenza,inj,Quad PF,6+ Mos 02/10/2015, 01/07/2016, 02/04/2017, 02/10/2018, 02/16/2019, 04/10/2020, 04/11/2021, 04/17/2022   Influenza-Unspecified 02/17/2014   Janssen (J&J) SARS-COV-2 Vaccination 10/25/2019   Td 09/14/2008   Tdap 02/16/2019   Zoster Recombinant(Shingrix) 04/10/2020, 11/22/2020    Health Maintenance  Topic Date Due   Pneumococcal Vaccine 64-3 Years old (1 of 2 - PCV) Never done   COVID-19 Vaccine (2 - 2023-24 season) 01/11/2023   Lung Cancer Screening  07/10/2023   MAMMOGRAM  12/21/2023   Colonoscopy  08/21/2026   Cervical Cancer Screening (HPV/Pap Cotest)  04/18/2027   DTaP/Tdap/Td (3 - Td or Tdap) 02/15/2029   INFLUENZA VACCINE  Completed   Hepatitis C Screening  Completed   HIV Screening  Completed   Zoster Vaccines- Shingrix  Completed   HPV VACCINES  Aged Out    Discussed health benefits of physical activity, and encouraged her to engage in regular exercise appropriate for her age and condition.  Problem List Items Addressed This Visit       Cardiovascular and Mediastinum   HYPERTENSION, BENIGN ESSENTIAL   Relevant Medications   losartan-hydrochlorothiazide (HYZAAR) 50-12.5 MG tablet     Endocrine   INSULIN RESISTANCE SYNDROME   Relevant Orders   Hemoglobin A1c   Other Visit Diagnoses     Wellness examination    -  Primary   Relevant Orders   CMP14+EGFR   Hemoglobin A1c   Encounter for immunization       Relevant Orders   Flu vaccine trivalent PF, 6mos and older(Flulaval,Afluria,Fluarix,Fluzone) (Completed)      Return in about 6 months (around 10/21/2023) for Hypertension.     Breanna Gasser, MD

## 2023-04-23 ENCOUNTER — Other Ambulatory Visit: Payer: Self-pay | Admitting: Family Medicine

## 2023-04-23 DIAGNOSIS — E785 Hyperlipidemia, unspecified: Secondary | ICD-10-CM

## 2023-04-23 LAB — CMP14+EGFR
ALT: 15 [IU]/L (ref 0–32)
AST: 18 [IU]/L (ref 0–40)
Albumin: 4.7 g/dL (ref 3.9–4.9)
Alkaline Phosphatase: 94 [IU]/L (ref 44–121)
BUN/Creatinine Ratio: 17 (ref 12–28)
BUN: 12 mg/dL (ref 8–27)
Bilirubin Total: 0.8 mg/dL (ref 0.0–1.2)
CO2: 26 mmol/L (ref 20–29)
Calcium: 10.8 mg/dL — ABNORMAL HIGH (ref 8.7–10.3)
Chloride: 97 mmol/L (ref 96–106)
Creatinine, Ser: 0.72 mg/dL (ref 0.57–1.00)
Globulin, Total: 2.3 g/dL (ref 1.5–4.5)
Glucose: 97 mg/dL (ref 70–99)
Potassium: 4.6 mmol/L (ref 3.5–5.2)
Sodium: 137 mmol/L (ref 134–144)
Total Protein: 7 g/dL (ref 6.0–8.5)
eGFR: 93 mL/min/{1.73_m2} (ref >59)

## 2023-04-23 LAB — HEMOGLOBIN A1C
Est. average glucose Bld gHb Est-mCnc: 131 mg/dL
Hgb A1c MFr Bld: 6.2 % — ABNORMAL HIGH (ref 4.8–5.6)

## 2023-04-23 NOTE — Progress Notes (Signed)
Hi Breanna Gomez, metabolic panel overall looks good.  Calcium is just slightly elevated but if you are taking extra calcium then that is the most likely explanation.  A1c is 6.2 which is in the prediabetes range not quite as good as it was back in June so just really encouraged her to work on healthy diet and regular exercise to bring that down.

## 2023-04-24 ENCOUNTER — Other Ambulatory Visit: Payer: Self-pay | Admitting: Sports Medicine

## 2023-04-24 DIAGNOSIS — M5416 Radiculopathy, lumbar region: Secondary | ICD-10-CM

## 2023-05-12 ENCOUNTER — Other Ambulatory Visit: Payer: Self-pay | Admitting: Family Medicine

## 2023-05-12 DIAGNOSIS — J9801 Acute bronchospasm: Secondary | ICD-10-CM

## 2023-07-08 ENCOUNTER — Other Ambulatory Visit: Payer: Self-pay | Admitting: Family Medicine

## 2023-07-13 ENCOUNTER — Other Ambulatory Visit: Payer: Self-pay | Admitting: Family Medicine

## 2023-07-13 DIAGNOSIS — F411 Generalized anxiety disorder: Secondary | ICD-10-CM

## 2023-08-06 ENCOUNTER — Other Ambulatory Visit: Payer: Self-pay | Admitting: Family Medicine

## 2023-08-06 DIAGNOSIS — E785 Hyperlipidemia, unspecified: Secondary | ICD-10-CM

## 2023-09-29 ENCOUNTER — Ambulatory Visit: Payer: Self-pay

## 2023-09-29 ENCOUNTER — Encounter: Payer: Self-pay | Admitting: Family Medicine

## 2023-09-29 ENCOUNTER — Ambulatory Visit: Payer: Self-pay | Admitting: Family Medicine

## 2023-09-29 ENCOUNTER — Ambulatory Visit: Admitting: Family Medicine

## 2023-09-29 ENCOUNTER — Ambulatory Visit (INDEPENDENT_AMBULATORY_CARE_PROVIDER_SITE_OTHER)

## 2023-09-29 VITALS — BP 147/59 | HR 67 | Ht 63.0 in | Wt 144.0 lb

## 2023-09-29 DIAGNOSIS — R631 Polydipsia: Secondary | ICD-10-CM | POA: Diagnosis not present

## 2023-09-29 DIAGNOSIS — R109 Unspecified abdominal pain: Secondary | ICD-10-CM

## 2023-09-29 DIAGNOSIS — I10 Essential (primary) hypertension: Secondary | ICD-10-CM | POA: Diagnosis not present

## 2023-09-29 LAB — POCT URINALYSIS DIP (CLINITEK)
Bilirubin, UA: NEGATIVE
Glucose, UA: NEGATIVE mg/dL
Ketones, POC UA: NEGATIVE mg/dL
Leukocytes, UA: NEGATIVE
Nitrite, UA: NEGATIVE
POC PROTEIN,UA: NEGATIVE
Spec Grav, UA: 1.01 (ref 1.010–1.025)
Urobilinogen, UA: 0.2 U/dL
pH, UA: 6 (ref 5.0–8.0)

## 2023-09-29 NOTE — Progress Notes (Signed)
 Acute Office Visit  Subjective:     Patient ID: Breanna Gomez, female    DOB: [birth year], 63 y.o.   MRN: [id number]  No chief complaint on file.   HPI Patient is in today for left flank pain that started suddenly and woke her up at 1 AM.  She felt very nauseated and then had 1 episode of vomiting she just did not feel good she had chills and ended up staying home from work.  A couple days prior to that over the weekend she did have some abdominal pain.  He did do a urine strip at home and it did show some blood and leukocytes possibly.  She is feeling a little better today.  She says all day yesterday she just felt extremely thirsty she just kept drinking water and water.  Does have a history of prediabetes.  ROS      Objective:    BP (!) 147/59   Pulse 67   Ht 5\' 3"  (1.6 m)   Wt 144 lb 0.5 oz (65.3 kg)   LMP 05/12/2000   SpO2 99%   BMI 25.51 kg/m    Physical Exam Vitals and nursing note reviewed.  Constitutional:      Appearance: Normal appearance.  HENT:     Head: Normocephalic and atraumatic.  Eyes:     Conjunctiva/sclera: Conjunctivae normal.  Cardiovascular:     Rate and Rhythm: Normal rate and regular rhythm.  Pulmonary:     Effort: Pulmonary effort is normal.     Breath sounds: Normal breath sounds.  Abdominal:     General: Bowel sounds are normal. There is no distension.     Palpations: Abdomen is soft.     Tenderness: There is no abdominal tenderness. There is no guarding.  Musculoskeletal:     Comments: No CVA tenderness.    Skin:    General: Skin is warm and dry.  Neurological:     Mental Status: She is alert.  Psychiatric:        Mood and Affect: Mood normal.     Results for orders placed or performed in visit on 09/29/23  POCT URINALYSIS DIP (CLINITEK)  Result Value Ref Range   Color, UA yellow yellow   Clarity, UA clear clear   Glucose, UA negative negative mg/dL   Bilirubin, UA negative negative   Ketones, POC UA negative negative  mg/dL   Spec Grav, UA 2.831 5.176 - 1.025   Blood, UA moderate (A) negative   pH, UA 6.0 5.0 - 8.0   POC PROTEIN,UA negative negative, trace   Urobilinogen, UA 0.2 0.2 or 1.0 E.U./dL   Nitrite, UA Negative Negative   Leukocytes, UA Negative Negative        Assessment & Plan:   Problem List Items Addressed This Visit       Cardiovascular and Mediastinum   HYPERTENSION, BENIGN ESSENTIAL   Are quite elevated today.  Normally it is much better controlled and repeat did come down a little bit she actually has a follow-up appointment next month so we will address then.  I think some of it may be secondary to pain.      Other Visit Diagnoses       Left flank pain    -  Primary   Relevant Orders   CBC with Differential/Platelet   CMP14+EGFR   Hemoglobin A1c   CT RENAL STONE STUDY     Increased thirst       Relevant Orders  CBC with Differential/Platelet   CMP14+EGFR   Hemoglobin A1c     Abdominal pain, unspecified abdominal location       Relevant Orders   CT RENAL STONE STUDY   POCT URINALYSIS DIP (CLINITEK) (Completed)      Left flank pain-urinalysis negative for infection but positive for moderate blood.  Again she is feeling a little bit better today.  But pain was incredibly intense to the point she almost went to the emergency department yesterday she has a family history of kidney stones but she personally has never had a kidney stone herself we will move forward with CT stone protocol for further workup as well as get labs done.  No orders of the defined types were placed in this encounter.   No follow-ups on file.  Duaine German, MD

## 2023-09-29 NOTE — Progress Notes (Signed)
 Hi Breanna Gomez, just wanted to let you know no sign of kidney stones.  No gallstones.  Spleen pancreas and liver look normal.  Kidneys look okay.  Bowel also looks okay.  It is still possible that you passed a stone and we just do not see it anymore since you are feeling better today.  Not exactly sure what caused it but at least we ruled out any underlying problem or concern.  Hopefully you will just continue to feel better over the next couple of days.  I did not get a chance to mention it to you today but I did want to talk to you about possibly doing lung cancer screening.  It is a yearly low-dose CAT scan of the chest and lungs to evaluate for lesions and nodules.  You would be a candidate for that.  If it something that you are interested in let me know.  Also recommend getting your pneumonia vaccine up-to-date when you are feeling better.

## 2023-09-29 NOTE — Assessment & Plan Note (Signed)
 Are quite elevated today.  Normally it is much better controlled and repeat did come down a little bit she actually has a follow-up appointment next month so we will address then.  I think some of it may be secondary to pain.

## 2023-09-29 NOTE — Telephone Encounter (Signed)
  Chief Complaint: left flank pain Symptoms: chills, 1 episode of vomiting, bladder pressure/retention after urinating, left flank pain Frequency: x 1 day Pertinent Negatives: Patient denies frank blood in urine, fever Disposition: [] ED /[] Urgent Care (no appt availability in office) / [x] Appointment(In office/virtual)/ []  [facility]/ [] Home Care/ [] Refused Recommended Disposition /[] [place] [facility]/ []  Follow-up with PCP Additional Notes: Patient states Sunday night she started with abdominal pain near her stomach and she thought it was reflux or indigestion. She states she ate spinach Saturday night so she thought the symptoms were related. Abdominal pain has resolved but yesterday morning she woke up at 1am with left flank pain. She states she thought it was due to old age but recently she has been having bladder pressure and retention after urinating. Patient states she took a home urine test and it told her she has blood in her urine but states it is not visible.  Copied from CRM [id number]. Topic: Clinical - Red Word Triage >> Sep 29, 2023 10:49 AM Brynn Caras wrote: Red Word that prompted transfer to Nurse Triage: Severe kidney pain - blood in urine Reason for Disposition  Pain or burning with passing urine (urination)  Answer Assessment - Initial Assessment Questions 1. LOCATION: "Where does it hurt?" (e.g., left, right)     Left.  2. ONSET: "When did the pain start?"     Yesterday morning around 0100, woke her up from her sleep.  3. SEVERITY: "How bad is the pain?" (e.g., Scale 1-10; mild, moderate, or severe)   - MILD (1-3): doesn't interfere with normal activities    - MODERATE (4-7): interferes with normal activities or awakens from sleep    - SEVERE (8-10): excruciating pain and patient unable to do normal activities (stays in bed)       3-4/10. Pain was severe yesterday but she states died down after about 8 hours.  4. PATTERN: "Does the pain come and go, or is  it constant?"      Constant.  5. CAUSE: "What do you think is causing the pain?"     Unsure if it is an infection or kidney stone.  6. OTHER SYMPTOMS:  "Do you have any other symptoms?" (e.g., fever, abdomen pain, vomiting, leg weakness, burning with urination, blood in urine)     Chills, 1 episode of vomiting, urine tested positive for blood (home test), bladder pressure/retention  7. PREGNANCY:  "Is there any chance you are pregnant?" "When was your last menstrual period?"     N/A.  Protocols used: Flank Pain-A-AH

## 2023-09-29 NOTE — Telephone Encounter (Signed)
 Patient scheduled for OV today with Dr. Greer Leak

## 2023-09-30 LAB — CBC WITH DIFFERENTIAL/PLATELET
Basophils Absolute: 0.1 10*3/uL (ref 0.0–0.2)
Basos: 1 %
EOS (ABSOLUTE): 0.1 10*3/uL (ref 0.0–0.4)
Eos: 1 %
Hematocrit: 44.5 % (ref 34.0–46.6)
Hemoglobin: 14.4 g/dL (ref 11.1–15.9)
Immature Grans (Abs): 0 10*3/uL (ref 0.0–0.1)
Immature Granulocytes: 0 %
Lymphocytes Absolute: 2.9 10*3/uL (ref 0.7–3.1)
Lymphs: 32 %
MCH: 29.6 pg (ref 26.6–33.0)
MCHC: 32.4 g/dL (ref 31.5–35.7)
MCV: 92 fL (ref 79–97)
Monocytes Absolute: 0.5 10*3/uL (ref 0.1–0.9)
Monocytes: 6 %
Neutrophils Absolute: 5.4 10*3/uL (ref 1.4–7.0)
Neutrophils: 60 %
Platelets: 344 10*3/uL (ref 150–450)
RBC: 4.86 x10E6/uL (ref 3.77–5.28)
RDW: 12 % (ref 11.7–15.4)
WBC: 9.1 10*3/uL (ref 3.4–10.8)

## 2023-09-30 LAB — CMP14+EGFR
ALT: 14 IU/L (ref 0–32)
AST: 16 IU/L (ref 0–40)
Albumin: 4.4 g/dL (ref 3.9–4.9)
Alkaline Phosphatase: 83 IU/L (ref 44–121)
BUN/Creatinine Ratio: 13 (ref 12–28)
BUN: 10 mg/dL (ref 8–27)
Bilirubin Total: 0.3 mg/dL (ref 0.0–1.2)
CO2: 23 mmol/L (ref 20–29)
Calcium: 10.3 mg/dL (ref 8.7–10.3)
Chloride: 99 mmol/L (ref 96–106)
Creatinine, Ser: 0.76 mg/dL (ref 0.57–1.00)
Globulin, Total: 1.8 g/dL (ref 1.5–4.5)
Glucose: 95 mg/dL (ref 70–99)
Potassium: 4.1 mmol/L (ref 3.5–5.2)
Sodium: 138 mmol/L (ref 134–144)
Total Protein: 6.2 g/dL (ref 6.0–8.5)
eGFR: 87 mL/min/{1.73_m2} (ref >59)

## 2023-09-30 LAB — HEMOGLOBIN A1C
Est. average glucose Bld gHb Est-mCnc: 128 mg/dL
Hgb A1c MFr Bld: 6.1 % — ABNORMAL HIGH (ref 4.8–5.6)

## 2023-09-30 NOTE — Progress Notes (Signed)
 HI Lar[place]ie,  A1c is stable at 6.1 in the prediabetes range.  Blood count and metabolic panel look great.

## 2023-10-21 ENCOUNTER — Ambulatory Visit: Payer: 59 | Admitting: Family Medicine

## 2023-11-16 ENCOUNTER — Other Ambulatory Visit: Payer: Self-pay | Admitting: Family Medicine

## 2023-11-16 DIAGNOSIS — F411 Generalized anxiety disorder: Secondary | ICD-10-CM

## 2023-12-09 ENCOUNTER — Other Ambulatory Visit: Payer: Self-pay | Admitting: Sports Medicine

## 2023-12-09 ENCOUNTER — Other Ambulatory Visit: Payer: Self-pay | Admitting: Family Medicine

## 2023-12-09 DIAGNOSIS — M5416 Radiculopathy, lumbar region: Secondary | ICD-10-CM

## 2023-12-09 DIAGNOSIS — E785 Hyperlipidemia, unspecified: Secondary | ICD-10-CM

## 2023-12-09 DIAGNOSIS — M19012 Primary osteoarthritis, left shoulder: Secondary | ICD-10-CM

## 2023-12-09 DIAGNOSIS — I10 Essential (primary) hypertension: Secondary | ICD-10-CM

## 2023-12-23 ENCOUNTER — Ambulatory Visit: Payer: Self-pay | Admitting: Sports Medicine

## 2023-12-24 ENCOUNTER — Encounter: Payer: Self-pay | Admitting: Sports Medicine

## 2023-12-24 ENCOUNTER — Ambulatory Visit: Payer: Self-pay | Admitting: Sports Medicine

## 2023-12-24 DIAGNOSIS — M19012 Primary osteoarthritis, left shoulder: Secondary | ICD-10-CM

## 2023-12-24 DIAGNOSIS — M5416 Radiculopathy, lumbar region: Secondary | ICD-10-CM | POA: Diagnosis not present

## 2023-12-24 MED ORDER — TRAMADOL HCL 50 MG PO TABS: 50.0000 mg | ORAL_TABLET | Freq: Every day | ORAL | 3 refills | Status: AC | PRN

## 2023-12-24 MED ORDER — CELECOXIB 200 MG PO CAPS: ORAL_CAPSULE | ORAL | 3 refills | Status: AC

## 2023-12-24 MED ORDER — CYCLOBENZAPRINE HCL 10 MG PO TABS: ORAL_TABLET | ORAL | 0 refills | Status: AC

## 2023-12-24 NOTE — Assessment & Plan Note (Signed)
 This pleasant 63 year old female returns, she has glenohumeral joint osteoarthritis, we last saw her in July 2024, she had had a glenohumeral joint injection with ultrasound guidance and had done well. She then had a recurrence of pain, she saw Dr. Mitchell with [facility], had a glenohumeral joint injection with ultrasound guidance back in June, unfortunately still has not noted sufficient efficacy. She understands that the neck step is operative intervention. At this point she still happy to try medical management, I will refill her Celebrex  and tramadol , she did ask for Flexeril  as well so we will send this in too, she can return to see me on an as needed basis.

## 2023-12-24 NOTE — Progress Notes (Signed)
    Procedures performed today:    None.  Independent interpretation of notes and tests performed by another provider:   None.  Brief History, Exam, Impression, and Recommendations:    Primary osteoarthritis of left shoulder This pleasant 62 year old female returns, she has glenohumeral joint osteoarthritis, we last saw her in July 2024, she had had a glenohumeral joint injection with ultrasound guidance and had done well. She then had a recurrence of pain, she saw Dr. Mitchell with [facility], had a glenohumeral joint injection with ultrasound guidance back in June, unfortunately still has not noted sufficient efficacy. She understands that the neck step is operative intervention. At this point she still happy to try medical management, I will refill her Celebrex  and tramadol , she did ask for Flexeril  as well so we will send this in too, she can return to see me on an as needed basis.    ____________________________________________ Debby PARAS. Curtis, M.D., ABFM., CAQSM., AME. Primary Care and Sports Medicine [facility]  Adjunct Professor of [facility] Medicine  [employer]  Restaurant manager, fast food

## 2024-01-12 ENCOUNTER — Encounter: Payer: Self-pay | Admitting: Sports Medicine

## 2024-01-20 ENCOUNTER — Ambulatory Visit: Admitting: Family Medicine

## 2024-01-20 ENCOUNTER — Encounter: Payer: Self-pay | Admitting: Family Medicine

## 2024-01-20 VITALS — BP 120/63 | HR 66 | Temp 97.8°F | Resp 20 | Ht 63.0 in | Wt 142.0 lb

## 2024-01-20 DIAGNOSIS — F17200 Nicotine dependence, unspecified, uncomplicated: Secondary | ICD-10-CM | POA: Diagnosis not present

## 2024-01-20 DIAGNOSIS — F411 Generalized anxiety disorder: Secondary | ICD-10-CM | POA: Diagnosis not present

## 2024-01-20 DIAGNOSIS — E348 Other specified endocrine disorders: Secondary | ICD-10-CM | POA: Diagnosis not present

## 2024-01-20 DIAGNOSIS — I10 Essential (primary) hypertension: Secondary | ICD-10-CM

## 2024-01-20 MED ORDER — VARENICLINE TARTRATE 1 MG PO TABS: ORAL_TABLET | ORAL | 1 refills | Status: DC

## 2024-01-20 MED ORDER — ALPRAZOLAM 0.5 MG PO TABS: 0.2500 mg | ORAL_TABLET | Freq: Every day | ORAL | 0 refills | Status: DC | PRN

## 2024-01-20 MED ORDER — VARENICLINE TARTRATE 1 MG PO TABS: ORAL_TABLET | ORAL | 1 refills | Status: AC

## 2024-01-20 NOTE — Assessment & Plan Note (Signed)
 BP at goal

## 2024-01-20 NOTE — Assessment & Plan Note (Signed)
 Still in prediabetes range. Continue to work on healthy diet and exercise  Lab Results  Component Value Date   HGBA1C 6.1 (H) 09/29/2023

## 2024-01-20 NOTE — Progress Notes (Signed)
 Established Patient Office Visit  Subjective  Patient ID: ARLENY KRUGER, female    DOB: 24-Oct-1960  Age: 63 y.o. MRN: [id number]  Chief Complaint  Patient presents with   Hypertension    HPI Discussed the use of AI scribe software for clinical note transcription with the patient, who gave verbal consent to proceed.  History of Present Illness LEAUNA SHARBER is a 63 year old female who presents with worsening chronic shoulder pain and consideration for shoulder replacement surgery.  Chronic shoulder and neck pain - Chronic shoulder pain progressively worsening over the past year - Pain radiates around the neck and down the arm - Initial injury occurred in 2020 while lifting weights - MRI in 2020 showed slight tendinitis - Severe pain significantly impacts sleep and daily comfort - Difficulty finding relief without increased medication use - Describes herself as miserable due to pain - Currently under care of Dr. Hendrick; previously followed by Dr. ONEIDA and Dr. Rosslyn (released in 2022) - Scheduled for MRIs of neck and shoulder next week for further evaluation  Medication use and side effects - Current medications include Celebrex  and Celexa  - Increased Celexa  dose during winter, resulting in 'fuzzy headed' side effects - Uses Klonopin  sparingly (approximately 15 pills over two months) - Considering switching from Klonopin  to alprazolam  due to concerns about effects  Nicotine dependence - Interested in starting generic Chantix  to quit smoking - Previously used Chantix  successfully years ago - wants to quit before surgery   Psychological stress and anxiety - Significant stress related to household issues, including water damage in basement and chimney - Financial burden of repairs estimated at $30,000 to $40,000 contributes to anxiety  Glycemic control and diet - Recently celebrated 63th birthday with friends and family, consuming sweets - Concerned that increased sweets  intake may negatively impact A1c levels  Physical activity - Attempts to stay active but finds it challenging during winter months   Mammo has been scheduled.     ROS    Objective:     BP 120/63 (BP Location: Left Arm, Patient Position: Sitting, Cuff Size: Normal)   Pulse 66   Temp 97.8 F (36.6 C) (Oral)   Resp 20   Ht 5' 3 (1.6 m)   Wt 142 lb (64.4 kg)   LMP 05/12/2000   SpO2 99%   BMI 25.15 kg/m    Physical Exam Vitals and nursing note reviewed.  Constitutional:      Appearance: Normal appearance.  HENT:     Head: Normocephalic and atraumatic.  Eyes:     Conjunctiva/sclera: Conjunctivae normal.  Cardiovascular:     Rate and Rhythm: Normal rate and regular rhythm.  Pulmonary:     Effort: Pulmonary effort is normal.     Breath sounds: Normal breath sounds.  Skin:    General: Skin is warm and dry.  Neurological:     Mental Status: She is alert.  Psychiatric:        Mood and Affect: Mood normal.      No results found for any visits on 01/20/24.    The 10-year ASCVD risk score (Arnett DK, et al., 2019) is: 9.8%    Assessment & Plan:   Problem List Items Addressed This Visit       Cardiovascular and Mediastinum   HYPERTENSION, BENIGN ESSENTIAL - Primary   BP at goal!!!      Relevant Orders   Lipid Panel With LDL/HDL Ratio   HgB A1c  Endocrine   INSULIN RESISTANCE SYNDROME   Still in prediabetes range. Continue to work on healthy diet and exercise  Lab Results  Component Value Date   HGBA1C 6.1 (H) 09/29/2023         Relevant Orders   Lipid Panel With LDL/HDL Ratio   HgB A1c     Other   Smoker   Relevant Medications   varenicline  (CHANTIX ) 1 MG tablet   ANXIETY DISORDER, GENERALIZED   Relevant Medications   ALPRAZolam  (XANAX ) 0.5 MG tablet   Other Relevant Orders   Lipid Panel With LDL/HDL Ratio   HgB A1c   Assessment and Plan Assessment & Plan Chronic right shoulder pain with osteoarthritis Chronic pain with  osteoarthritis, MRI pending for further evaluation. Considering surgery but patient apprehensive. - Order MRI for neck and shoulder next week. - Consider referral to neurosurgeon or neurologist after MRI results. - Discuss potential surgical intervention with Dr. Hendrick.  Nicotine dependence Nicotine dependence with intent to quit, especially due to potential surgery. Discussed varenicline  for cessation. - Prescribe generic Chantix  (varenicline ). - Advise setting a quit date. - Send prescription to [facility] for [employer].  Generalized anxiety disorder and depression Anxiety and depression exacerbated by stress. Previous Celexa  increase caused side effects. Discussed medication switch for anxiety. - Switch clonazepam  to alprazolam  for anxiety management.  IFG -  concern for elevated A1c. Emphasized maintaining physical activity. - Order A1c test. - Encourage exploration of chair exercises or YouTube exercise videos for indoor activity.   Return in about 6 months (around 07/19/2024) for Hypertension, Pre-diabetes.    Dorothyann Byars, MD

## 2024-01-21 LAB — LIPID PANEL WITH LDL/HDL RATIO
Cholesterol, Total: 143 mg/dL (ref 100–199)
HDL: 62 mg/dL (ref >39)
LDL Chol Calc (NIH): 61 mg/dL (ref 0–99)
LDL/HDL Ratio: 1 ratio (ref 0.0–3.2)
Triglycerides: 112 mg/dL (ref 0–149)
VLDL Cholesterol Cal: 20 mg/dL (ref 5–40)

## 2024-01-21 LAB — HEMOGLOBIN A1C
Est. average glucose Bld gHb Est-mCnc: 128 mg/dL
Hgb A1c MFr Bld: 6.1 % — ABNORMAL HIGH (ref 4.8–5.6)

## 2024-01-22 ENCOUNTER — Ambulatory Visit: Payer: Self-pay | Admitting: Family Medicine

## 2024-01-22 NOTE — Progress Notes (Signed)
 Hi Derrika, A1c is 6.1 which is stable from May.  Still in the prediabetes range.  Cholesterol overall looks good.

## 2024-01-26 ENCOUNTER — Telehealth: Payer: Self-pay

## 2024-01-26 ENCOUNTER — Other Ambulatory Visit (HOSPITAL_COMMUNITY): Payer: Self-pay

## 2024-01-26 NOTE — Telephone Encounter (Signed)
 Pharmacy Patient Advocate Encounter   Received notification from Patient Pharmacy that prior authorization for Varenicline  1mg  tabs is required/requested.   Insurance verification completed.   The patient is insured through [facility] .   Per test claim: PA required; PA submitted to above mentioned insurance via Latent Key/confirmation #/[facility] Status is pending

## 2024-01-27 NOTE — Telephone Encounter (Signed)
 Pharmacy Patient Advocate Encounter  Received notification from [employer] that Prior Authorization for Varenicline  1mg  tabs has been APPROVED from 01/26/24 to 01/25/25   PA #/Case ID/Reference #: [id number]

## 2024-02-04 LAB — HM MAMMOGRAPHY

## 2024-02-05 ENCOUNTER — Encounter: Payer: Self-pay | Admitting: Family Medicine

## 2024-03-18 ENCOUNTER — Other Ambulatory Visit: Payer: Self-pay | Admitting: Family Medicine

## 2024-03-18 DIAGNOSIS — I10 Essential (primary) hypertension: Secondary | ICD-10-CM

## 2024-03-18 DIAGNOSIS — F411 Generalized anxiety disorder: Secondary | ICD-10-CM

## 2024-04-25 ENCOUNTER — Encounter: Admitting: Family Medicine

## 2024-05-06 ENCOUNTER — Other Ambulatory Visit: Payer: Self-pay | Admitting: Family Medicine

## 2024-05-06 DIAGNOSIS — F411 Generalized anxiety disorder: Secondary | ICD-10-CM

## 2024-05-06 NOTE — Telephone Encounter (Signed)
 ALPRAZOLAM  Last OV: 01/20/24 Next OV: 06/27/24 Last RF: 03/21/24

## 2024-06-09 ENCOUNTER — Other Ambulatory Visit: Payer: Self-pay | Admitting: Urgent Care

## 2024-06-09 ENCOUNTER — Other Ambulatory Visit: Payer: Self-pay | Admitting: Family Medicine

## 2024-06-09 DIAGNOSIS — I10 Essential (primary) hypertension: Secondary | ICD-10-CM

## 2024-06-09 DIAGNOSIS — F411 Generalized anxiety disorder: Secondary | ICD-10-CM

## 2024-06-09 DIAGNOSIS — E785 Hyperlipidemia, unspecified: Secondary | ICD-10-CM

## 2024-06-09 NOTE — Telephone Encounter (Signed)
 Meds ordered this encounter  Medications   ALPRAZolam  (XANAX ) 0.5 MG tablet    Sig: Take 1/2 to 1 tablet (0.25-0.5 mg total) by mouth daily as needed for anxiety.    Dispense:  15 tablet    Refill:  0

## 2024-06-27 ENCOUNTER — Encounter: Admitting: Family Medicine
